# Patient Record
Sex: Male | Born: 1945
Health system: Southern US, Community
[De-identification: ages and names within clinical notes are randomized; demographics above are authoritative.]

## PROBLEM LIST (undated history)

## (undated) DIAGNOSIS — E7800 Pure hypercholesterolemia, unspecified: Secondary | ICD-10-CM

## (undated) DIAGNOSIS — J449 Chronic obstructive pulmonary disease, unspecified: Secondary | ICD-10-CM

## (undated) DIAGNOSIS — I739 Peripheral vascular disease, unspecified: Secondary | ICD-10-CM

## (undated) DIAGNOSIS — R911 Solitary pulmonary nodule: Secondary | ICD-10-CM

## (undated) DIAGNOSIS — E119 Type 2 diabetes mellitus without complications: Secondary | ICD-10-CM

## (undated) DIAGNOSIS — H35039 Hypertensive retinopathy, unspecified eye: Secondary | ICD-10-CM

## (undated) DIAGNOSIS — R0600 Dyspnea, unspecified: Secondary | ICD-10-CM

## (undated) DIAGNOSIS — M1990 Unspecified osteoarthritis, unspecified site: Secondary | ICD-10-CM

## (undated) DIAGNOSIS — I10 Essential (primary) hypertension: Secondary | ICD-10-CM

## (undated) DIAGNOSIS — Z923 Personal history of irradiation: Secondary | ICD-10-CM

## (undated) DIAGNOSIS — K219 Gastro-esophageal reflux disease without esophagitis: Secondary | ICD-10-CM

## (undated) HISTORY — PX: APPENDECTOMY: SHX54

## (undated) HISTORY — PX: CHOLECYSTECTOMY: SHX55

---

## 2001-11-18 ENCOUNTER — Emergency Department (HOSPITAL_COMMUNITY): Admission: EM | Admit: 2001-11-18 | Discharge: 2001-11-18 | Payer: Self-pay

## 2014-02-01 ENCOUNTER — Encounter (HOSPITAL_COMMUNITY): Payer: Self-pay | Admitting: Emergency Medicine

## 2014-02-01 ENCOUNTER — Emergency Department (INDEPENDENT_AMBULATORY_CARE_PROVIDER_SITE_OTHER)
Admission: EM | Admit: 2014-02-01 | Discharge: 2014-02-01 | Disposition: A | Discharge status: Home / Self Care | Payer: Self-pay | Source: Home / Self Care

## 2014-02-01 DIAGNOSIS — M79604 Pain in right leg: Secondary | ICD-10-CM

## 2014-02-01 DIAGNOSIS — M79609 Pain in unspecified limb: Secondary | ICD-10-CM

## 2014-02-01 DIAGNOSIS — I739 Peripheral vascular disease, unspecified: Secondary | ICD-10-CM

## 2014-02-01 DIAGNOSIS — M722 Plantar fascial fibromatosis: Secondary | ICD-10-CM

## 2014-02-01 NOTE — ED Provider Notes (Signed)
CSN: [id number]     Arrival date & time 02/01/14  1343 History   First MD Initiated Contact with Patient 02/01/14 1528     No chief complaint on file.  (Consider location/radiation/quality/duration/timing/severity/associated sxs/prior Treatment) HPI Comments: 68 year old male complaining of pain in the left leg. More specifically the pain radiates from the knee to the tips of the toes. He has tingling in the feet. The majority of the pain with weightbearing he is in the ankle and plantar aspect of the foot. The symptoms began approximately 2 weeks ago. He denies any known trauma or event that may have injured his leg or produced the symptoms.   No past medical history on file. No past surgical history on file. No family history on file. History  Substance Use Topics  . Smoking status: Not on file  . Smokeless tobacco: Not on file  . Alcohol Use: Not on file    Review of Systems  Constitutional: Negative.   Respiratory: Negative.   Gastrointestinal: Negative.   Genitourinary: Negative.   Musculoskeletal: Negative for arthralgias, myalgias and neck pain.       As per HPI  Skin: Negative.   Neurological: Negative for dizziness, weakness, numbness and headaches.    Allergies  Review of patient's allergies indicates not on file.  Home Medications  No current outpatient prescriptions on file. BP 187/91  Pulse 64  Temp(Src) 97.7 F (36.5 C) (Oral)  Resp 20  SpO2 97% Physical Exam  Constitutional: He is oriented to person, place, and time. He appears well-developed and well-nourished.  HENT:  Head: Normocephalic and atraumatic.  Eyes: EOM are normal.  Neck: Normal range of motion. Neck supple.  Pulmonary/Chest: Effort normal. No respiratory distress.  Musculoskeletal:  Miner, 1+ nonpitting edema and bilateral lower legs. Palpation and examination of the knee reveals no abnormalities. No tenderness to the knee no decrease in range of motion, no swelling, discoloration or  laxity. Palpation of the calf is nontender. There is no swelling or nodules. Ankle exam is normal with full range of motion and no bony tenderness. Palpation of the reveals good circulation, 2+ pedal pulse, normal color and warmth. There is tenderness to the plantar heel that is worse when he first plans his foot on the floor in the morning. There is also a change in the anterior Sheehan with a scant becoming slightly shiny and darkening.  Neurological: He is alert and oriented to person, place, and time. No cranial nerve deficit.  Skin: Skin is warm and dry.  Psychiatric: He has a normal mood and affect.    ED Course  Procedures (including critical care time) Labs Review Labs Reviewed - No data to display Imaging Review No results found.    MDM   1. Leg pain, right   2. Plantar fasciitis of left foot   3. Peripheral vascular disease of lower extremity      Recommend rolling foot over ice cold can several times a day Painful full length she supports with high arches. Recommend [place] PCP regarding circulation of the legs and other evaluations said she had not seen a physician in over 10 years. Suspect there may be peripheral vascular disease. Recommend stop smoking. No evidence of DVT or ischemic changes.    Hayden Rasmussen, NP 02/01/14 [phone]

## 2014-02-01 NOTE — Discharge Instructions (Signed)
Plantar Fasciitis Plantar fasciitis is a common condition that causes foot pain. It is soreness (inflammation) of the band of tough fibrous tissue on the bottom of the foot that runs from the heel bone (calcaneus) to the ball of the foot. The cause of this soreness may be from excessive standing, poor fitting shoes, running on hard surfaces, being overweight, having an abnormal walk, or overuse (this is common in runners) of the painful foot or feet. It is also common in aerobic exercise dancers and ballet dancers. SYMPTOMS  Most people with plantar fasciitis complain of:  Severe pain in the morning on the bottom of their foot especially when taking the first steps out of bed. This pain recedes after a few minutes of walking.  Severe pain is experienced also during walking following a long period of inactivity.  Pain is worse when walking barefoot or up stairs DIAGNOSIS   Your caregiver will diagnose this condition by examining and feeling your foot.  Special tests such as X-rays of your foot, are usually not needed. PREVENTION   Consult a sports medicine professional before beginning a new exercise program.  Walking programs offer a good workout. With walking there is a lower chance of overuse injuries common to runners. There is less impact and less jarring of the joints.  Begin all new exercise programs slowly. If problems or pain develop, decrease the amount of time or distance until you are at a comfortable level.  Wear good shoes and replace them regularly.  Stretch your foot and the heel cords at the back of the ankle (Achilles tendon) both before and after exercise.  Run or exercise on even surfaces that are not hard. For example, asphalt is better than pavement.  Do not run barefoot on hard surfaces.  If using a treadmill, vary the incline.  Do not continue to workout if you have foot or joint problems. Seek professional help if they do not improve. HOME CARE INSTRUCTIONS     Avoid activities that cause you pain until you recover.  Use ice or cold packs on the problem or painful areas after working out.  Only take over-the-counter or prescription medicines for pain, discomfort, or fever as directed by your caregiver.  Soft shoe inserts or athletic shoes with air or gel sole cushions may be helpful.  If problems continue or become more severe, consult a sports medicine caregiver or your own health care provider. Cortisone is a potent anti-inflammatory medication that may be injected into the painful area. You can discuss this treatment with your caregiver. MAKE SURE YOU:   Understand these instructions.  Will watch your condition.  Will get help right away if you are not doing well or get worse. Document Released: 09/06/2001 Document Revised: 03/05/2012 Document Reviewed: 11/05/2008 [employer] Patient Information 2014 [employer]. Plantar Fasciitis (Heel Spur Syndrome) with Rehab The plantar fascia is a fibrous, ligament-like, soft-tissue structure that spans the bottom of the foot. Plantar fasciitis is a condition that causes pain in the foot due to inflammation of the tissue. SYMPTOMS  Pain and tenderness on the underneath side of the foot. Pain that worsens with standing or walking. CAUSES  Plantar fasciitis is caused by irritation and injury to the plantar fascia on the underneath side of the foot. Common mechanisms of injury include: Direct trauma to bottom of the foot. Damage to a small nerve that runs under the foot where the main fascia attaches to the heel bone. Stress placed on the plantar fascia due   to bone spurs. RISK INCREASES WITH:  Activities that place stress on the plantar fascia (running, jumping, pivoting, or cutting). Poor strength and flexibility. Improperly fitted shoes. Tight calf muscles. Flat feet. Failure to warm-up properly before activity. Obesity. PREVENTION Warm up and stretch properly before activity. Allow for  adequate recovery between workouts. Maintain physical fitness: Strength, flexibility, and endurance. Cardiovascular fitness. Maintain a health body weight. Avoid stress on the plantar fascia. Wear properly fitted shoes, including arch supports for individuals who have flat feet. PROGNOSIS  If treated properly, then the symptoms of plantar fasciitis usually resolve without surgery. However, occasionally surgery is necessary. RELATED COMPLICATIONS  Recurrent symptoms that may result in a chronic condition. Problems of the lower back that are caused by compensating for the injury, such as limping. Pain or weakness of the foot during push-off following surgery. Chronic inflammation, scarring, and partial or complete fascia tear, occurring more often from repeated injections. TREATMENT  Treatment initially involves the use of ice and medication to help reduce pain and inflammation. The use of strengthening and stretching exercises may help reduce pain with activity, especially stretches of the Achilles tendon. These exercises may be performed at home or with a therapist. Your caregiver may recommend that you use heel cups of arch supports to help reduce stress on the plantar fascia. Occasionally, corticosteroid injections are given to reduce inflammation. If symptoms persist for greater than 6 months despite non-surgical (conservative), then surgery may be recommended.  MEDICATION  If pain medication is necessary, then nonsteroidal anti-inflammatory medications, such as aspirin and ibuprofen, or other minor pain relievers, such as acetaminophen, are often recommended. Do not take pain medication within 7 days before surgery. Prescription pain relievers may be given if deemed necessary by your caregiver. Use only as directed and only as much as you need. Corticosteroid injections may be given by your caregiver. These injections should be reserved for the most serious cases, because they may only be  given a certain number of times. HEAT AND COLD Cold treatment (icing) relieves pain and reduces inflammation. Cold treatment should be applied for 10 to 15 minutes every 2 to 3 hours for inflammation and pain and immediately after any activity that aggravates your symptoms. Use ice packs or massage the area with a piece of ice (ice massage). Heat treatment may be used prior to performing the stretching and strengthening activities prescribed by your caregiver, physical therapist, or athletic trainer. Use a heat pack or soak the injury in warm water. SEEK IMMEDIATE MEDICAL CARE IF: Treatment seems to offer no benefit, or the condition worsens. Any medications produce adverse side effects. EXERCISES RANGE OF MOTION (ROM) AND STRETCHING EXERCISES - Plantar Fasciitis (Heel Spur Syndrome) These exercises may help you when beginning to rehabilitate your injury. Your symptoms may resolve with or without further involvement from your physician, physical therapist or athletic trainer. While completing these exercises, remember:  Restoring tissue flexibility helps normal motion to return to the joints. This allows healthier, less painful movement and activity. An effective stretch should be held for at least 30 seconds. A stretch should never be painful. You should only feel a gentle lengthening or release in the stretched tissue. RANGE OF MOTION - Toe Extension, Flexion Sit with your right / left leg crossed over your opposite knee. Grasp your toes and gently pull them back toward the top of your foot. You should feel a stretch on the bottom of your toes and/or foot. Hold this stretch for __________ seconds. Now, gently   pull your toes toward the bottom of your foot. You should feel a stretch on the top of your toes and or foot. Hold this stretch for __________ seconds. Repeat __________ times. Complete this stretch __________ times per day.  RANGE OF MOTION - Ankle Dorsiflexion, Active Assisted Remove  shoes and sit on a chair that is preferably not on a carpeted surface. Place right / left foot under knee. Extend your opposite leg for support. Keeping your heel down, slide your right / left foot back toward the chair until you feel a stretch at your ankle or calf. If you do not feel a stretch, slide your bottom forward to the edge of the chair, while still keeping your heel down. Hold this stretch for __________ seconds. Repeat __________ times. Complete this stretch __________ times per day.  STRETCH  Gastroc, Standing Place hands on wall. Extend right / left leg, keeping the front knee somewhat bent. Slightly point your toes inward on your back foot. Keeping your right / left heel on the floor and your knee straight, shift your weight toward the wall, not allowing your back to arch. You should feel a gentle stretch in the right / left calf. Hold this position for __________ seconds. Repeat __________ times. Complete this stretch __________ times per day. STRETCH  Soleus, Standing Place hands on wall. Extend right / left leg, keeping the other knee somewhat bent. Slightly point your toes inward on your back foot. Keep your right / left heel on the floor, bend your back knee, and slightly shift your weight over the back leg so that you feel a gentle stretch deep in your back calf. Hold this position for __________ seconds. Repeat __________ times. Complete this stretch __________ times per day. STRETCH  Gastrocsoleus, Standing  Note: This exercise can place a lot of stress on your foot and ankle. Please complete this exercise only if specifically instructed by your caregiver.  Place the ball of your right / left foot on a step, keeping your other foot firmly on the same step. Hold on to the wall or a rail for balance. Slowly lift your other foot, allowing your body weight to press your heel down over the edge of the step. You should feel a stretch in your right / left calf. Hold this  position for __________ seconds. Repeat this exercise with a slight bend in your right / left knee. Repeat __________ times. Complete this stretch __________ times per day.  STRENGTHENING EXERCISES - Plantar Fasciitis (Heel Spur Syndrome)  These exercises may help you when beginning to rehabilitate your injury. They may resolve your symptoms with or without further involvement from your physician, physical therapist or athletic trainer. While completing these exercises, remember:  Muscles can gain both the endurance and the strength needed for everyday activities through controlled exercises. Complete these exercises as instructed by your physician, physical therapist or athletic trainer. Progress the resistance and repetitions only as guided. STRENGTH - Towel Curls Sit in a chair positioned on a non-carpeted surface. Place your foot on a towel, keeping your heel on the floor. Pull the towel toward your heel by only curling your toes. Keep your heel on the floor. If instructed by your physician, physical therapist or athletic trainer, add ____________________ at the end of the towel. Repeat __________ times. Complete this exercise __________ times per day. STRENGTH - Ankle Inversion Secure one end of a rubber exercise band/tubing to a fixed object (table, pole). Loop the other end around your foot   just before your toes. Place your fists between your knees. This will focus your strengthening at your ankle. Slowly, pull your big toe up and in, making sure the band/tubing is positioned to resist the entire motion. Hold this position for __________ seconds. Have your muscles resist the band/tubing as it slowly pulls your foot back to the starting position. Repeat __________ times. Complete this exercises __________ times per day.  Document Released: 12/12/2005 Document Revised: 03/05/2012 Document Reviewed: 03/26/2009 Exit[employer] Patient Information 2014 [employer].  

## 2014-02-01 NOTE — ED Notes (Signed)
Discussed follow up care with a physician.  Discussed flyers advertising local options for pcp

## 2014-02-01 NOTE — ED Provider Notes (Signed)
Medical screening examination/treatment/procedure(s) were performed by a resident physician or non-physician practitioner and as the supervising physician I was immediately available for consultation/collaboration.  Clementeen GrahamEvan Corey, MD    Rodolph BongEvan S Corey, MD 02/01/14 [phone]

## 2014-02-01 NOTE — ED Notes (Signed)
Patient reports bilateral foot swelling and back pain.  Complaints for 2 weeks.  Patient has been using heating pad

## 2016-04-21 ENCOUNTER — Ambulatory Visit (INDEPENDENT_AMBULATORY_CARE_PROVIDER_SITE_OTHER): Payer: Medicare HMO

## 2016-04-21 ENCOUNTER — Encounter (HOSPITAL_COMMUNITY): Payer: Self-pay | Admitting: Emergency Medicine

## 2016-04-21 ENCOUNTER — Ambulatory Visit (HOSPITAL_COMMUNITY)
Admission: EM | Admit: 2016-04-21 | Discharge: 2016-04-21 | Disposition: A | Discharge status: Home / Self Care | Payer: Medicare HMO | Attending: Emergency Medicine | Admitting: Emergency Medicine

## 2016-04-21 DIAGNOSIS — I10 Essential (primary) hypertension: Secondary | ICD-10-CM

## 2016-04-21 DIAGNOSIS — M545 Low back pain, unspecified: Secondary | ICD-10-CM

## 2016-04-21 MED ORDER — CYCLOBENZAPRINE HCL 10 MG PO TABS: 10.0000 mg | ORAL_TABLET | Freq: Two times a day (BID) | ORAL | Status: DC | PRN

## 2016-04-21 MED ORDER — TRIAMTERENE-HCTZ 75-50 MG PO TABS: 1.0000 | ORAL_TABLET | Freq: Every day | ORAL | Status: DC

## 2016-04-21 NOTE — Discharge Instructions (Signed)
Back Pain, Adult Back pain is very common. The pain often gets better over time. The cause of back pain is usually not dangerous. Most people can learn to manage their back pain on their own.  HOME CARE  Watch your back pain for any changes. The following actions may help to lessen any pain you are feeling:  Stay active. Start with short walks on flat ground if you can. Try to walk farther each day.  Exercise regularly as told by your doctor. Exercise helps your back heal faster. It also helps avoid future injury by keeping your muscles strong and flexible.  Do not sit, drive, or stand in one place for more than 30 minutes.  Do not stay in bed. Resting more than 1-2 days can slow down your recovery.  Be careful when you bend or lift an object. Use good form when lifting:  Bend at your knees.  Keep the object close to your body.  Do not twist.  Sleep on a firm mattress. Lie on your side, and bend your knees. If you lie on your back, put a pillow under your knees.  Take medicines only as told by your doctor.  Put ice on the injured area.  Put ice in a plastic bag.  Place a towel between your skin and the bag.  Leave the ice on for 20 minutes, 2-3 times a day for the first 2-3 days. After that, you can switch between ice and heat packs.  Avoid feeling anxious or stressed. Find good ways to deal with stress, such as exercise.  Maintain a healthy weight. Extra weight puts stress on your back. GET HELP IF:   You have pain that does not go away with rest or medicine.  You have worsening pain that goes down into your legs or buttocks.  You have pain that does not get better in one week.  You have pain at night.  You lose weight.  You have a fever or chills. GET HELP RIGHT AWAY IF:   You cannot control when you poop (bowel movement) or pee (urinate).  Your arms or legs feel weak.  Your arms or legs lose feeling (numbness).  You feel sick to your stomach (nauseous) or  throw up (vomit).  You have belly (abdominal) pain.  You feel like you may pass out (faint).   This information is not intended to replace advice given to you by your health care provider. Make sure you discuss any questions you have with your health care provider.   Document Released: 05/30/2008 Document Revised: 01/02/2015 Document Reviewed: 04/15/2014 Elsevier Interactive Patient Education 2016 [employer]. Hypertension Hypertension is another name for high blood pressure. High blood pressure forces your heart to work harder to pump blood. A blood pressure reading has two numbers, which includes a higher number over a lower number (example: 110/72). HOME CARE   Have your blood pressure rechecked by your doctor.  Only take medicine as told by your doctor. Follow the directions carefully. The medicine does not work as well if you skip doses. Skipping doses also puts you at risk for problems.  Do not smoke.  Monitor your blood pressure at home as told by your doctor. GET HELP IF:  You think you are having a reaction to the medicine you are taking.  You have repeat headaches or feel dizzy.  You have puffiness (swelling) in your ankles.  You have trouble with your vision. GET HELP RIGHT AWAY IF:   You get  a very bad headache and are confused.  You feel weak, numb, or faint.  You get chest or belly (abdominal) pain.  You throw up (vomit).  You cannot breathe very well. MAKE SURE YOU:   Understand these instructions.  Will watch your condition.  Will get help right away if you are not doing well or get worse.   This information is not intended to replace advice given to you by your health care provider. Make sure you discuss any questions you have with your health care provider.   Document Released: 05/30/2008 Document Revised: 12/17/2013 Document Reviewed: 10/04/2013 Elsevier Interactive Patient Education [employer]

## 2016-04-21 NOTE — ED Notes (Signed)
Pt was mowing his yard on Saturday when he noticed a pain in his left lower back.  He states the pain radiates down his hip into the back of his left knee.

## 2016-04-21 NOTE — ED Provider Notes (Signed)
CSN: [id number]     Arrival date & time 04/21/16  1432 History   First MD Initiated Contact with Patient 04/21/16 1634     Chief Complaint  Patient presents with  . Back Pain   (Consider location/radiation/quality/duration/timing/severity/associated sxs/prior Treatment) HPI  History reviewed. No pertinent past medical history. History reviewed. No pertinent past surgical history. History reviewed. No pertinent family history. Social History  Substance Use Topics  . Smoking status: Never Smoker   . Smokeless tobacco: None  . Alcohol Use: Yes    Review of Systems  Allergies  Review of patient's allergies indicates no known allergies.  Home Medications   Prior to Admission medications   Not on File   Meds Ordered and Administered this Visit  Medications - No data to display  BP 209/100 mmHg  Pulse 64  Temp(Src) 98.1 F (36.7 C) (Oral)  Resp 16  SpO2 97% No data found.   Physical Exam NURSES NOTES AND VITAL SIGNS REVIEWED. CONSTITUTIONAL: Well developed, well nourished, no acute distress HEENT: normocephalic, atraumatic EYES: Conjunctiva normal NECK:normal ROM, supple, no adenopathy CV: RRR with murmur. No PMI displacement PULMONARY:No respiratory distress, normal effort ABDOMINAL: Soft, ND, NT BS+, No CVAT MUSCULOSKELETAL: Normal ROM of all extremities, LUMBA Spine palpable spasm, minimal tenderness.  SKIN: warm and dry without rash PSYCHIATRIC: Mood and affect, behavior are normal   ED Course  Procedures (including critical care time)  Labs Review Labs Reviewed - No data to display  Imaging Review Dg Lumbar Spine Complete  04/21/2016  CLINICAL DATA:  LEFT lower back pain for 5 days. Tingling down LEFT side of the leg. EXAM: LUMBAR SPINE - COMPLETE 4+ VIEW COMPARISON:  None. FINDINGS: There is joint space narrowing of the L5-S1 vertebral body level with endplate osteophytosis. No subluxation. No acute loss of vertebral body height or disc height. No pars  fracture. IMPRESSION: 1. No acute findings lumbar spine. 2. Disc osteophytic disease most severe at L5-S1. Electronically Signed   By: Genevive Bi M.D.   On: 04/21/2016 17:09    I HAVE PERSONALLY  REVIEWED AND DISCUSSED RESULTS OF  X-RAYS WITH PATIENT  PRIOR TO DISCHARGE.    Visual Acuity Review  Right Eye Distance:   Left Eye Distance:   Bilateral Distance:    Right Eye Near:   Left Eye Near:    Bilateral Near:      Rx: because of the level of your blood pressure it is best to start you on a combination medicine to try and help lower your pressure. MAXZIDE AND FLEXERIL FOR BACK PAIN  Total Visit Time:22 "GREATER THAN 50% WAS SPENT IN COUNSELING AND COORDINATION OF CARE WITH THE PATIENT" DISCUSSION OF TREATMENT PLAN FOR HIS BACK PAIN AND NEW MEDICATIONS FOR HYPERTENSION. ALSO DISCUSSED NEED OF PCP.   MDM   1. HTN (hypertension) with goal to be determined   2. Left-sided low back pain without sciatica     Patient is reassured that there are no issues that require transfer to higher level of care at this time or additional tests. Patient is advised to continue home symptomatic treatment. Patient is advised that if there are new or worsening symptoms to attend the emergency department, contact primary care provider, or return to [facility]. Instructions of care provided discharged home in stable condition.    THIS NOTE WAS GENERATED USING A VOICE RECOGNITION SOFTWARE PROGRAM. ALL REASONABLE EFFORTS  WERE MADE TO PROOFREAD THIS DOCUMENT FOR ACCURACY.  I have verbally reviewed the discharge instructions with  the patient. A printed AVS was given to the patient.  All questions were answered prior to discharge.     Tharon Aquas, PA 04/21/16 1721

## 2016-04-21 NOTE — ED Notes (Addendum)
Pt's BP is elevated today.  He reports some dizziness over the last three weeks with some numbness and tingling in his fingertips and some SOB with exertion.  He denies any headaches, vision changes or chest pain.  Pt is asymptomatic today.

## 2016-06-03 ENCOUNTER — Emergency Department (HOSPITAL_COMMUNITY)
Admission: EM | Admit: 2016-06-03 | Discharge: 2016-06-03 | Disposition: A | Discharge status: Home / Self Care | Payer: Commercial Managed Care - HMO | Attending: Emergency Medicine | Admitting: Emergency Medicine

## 2016-06-03 ENCOUNTER — Encounter (HOSPITAL_COMMUNITY): Payer: Self-pay | Admitting: Emergency Medicine

## 2016-06-03 ENCOUNTER — Ambulatory Visit (HOSPITAL_COMMUNITY)
Admission: EM | Admit: 2016-06-03 | Discharge: 2016-06-03 | Disposition: A | Discharge status: Nursing Home (Includes ICF) | Payer: Commercial Managed Care - HMO | Attending: Family Medicine | Admitting: Family Medicine

## 2016-06-03 ENCOUNTER — Encounter (HOSPITAL_COMMUNITY): Payer: Self-pay

## 2016-06-03 DIAGNOSIS — F17200 Nicotine dependence, unspecified, uncomplicated: Secondary | ICD-10-CM | POA: Diagnosis not present

## 2016-06-03 DIAGNOSIS — I10 Essential (primary) hypertension: Secondary | ICD-10-CM | POA: Insufficient documentation

## 2016-06-03 DIAGNOSIS — R062 Wheezing: Secondary | ICD-10-CM | POA: Diagnosis not present

## 2016-06-03 DIAGNOSIS — E669 Obesity, unspecified: Secondary | ICD-10-CM | POA: Diagnosis not present

## 2016-06-03 DIAGNOSIS — R600 Localized edema: Secondary | ICD-10-CM | POA: Diagnosis not present

## 2016-06-03 DIAGNOSIS — Z760 Encounter for issue of repeat prescription: Secondary | ICD-10-CM | POA: Insufficient documentation

## 2016-06-03 DIAGNOSIS — M545 Low back pain: Secondary | ICD-10-CM | POA: Diagnosis not present

## 2016-06-03 DIAGNOSIS — Z79899 Other long term (current) drug therapy: Secondary | ICD-10-CM | POA: Insufficient documentation

## 2016-06-03 DIAGNOSIS — M170 Bilateral primary osteoarthritis of knee: Secondary | ICD-10-CM | POA: Diagnosis not present

## 2016-06-03 DIAGNOSIS — Z6831 Body mass index (BMI) 31.0-31.9, adult: Secondary | ICD-10-CM | POA: Diagnosis not present

## 2016-06-03 HISTORY — DX: Essential (primary) hypertension: I10

## 2016-06-03 LAB — I-STAT CHEM 8, ED
BUN: 9 mg/dL (ref 6–20)
CALCIUM ION: 1.18 mmol/L (ref 1.13–1.30)
CREATININE: 0.9 mg/dL (ref 0.61–1.24)
Chloride: 106 mmol/L (ref 101–111)
GLUCOSE: 98 mg/dL (ref 65–99)
HCT: 49 % (ref 39.0–52.0)
Hemoglobin: 16.7 g/dL (ref 13.0–17.0)
Potassium: 5.1 mmol/L (ref 3.5–5.1)
SODIUM: 141 mmol/L (ref 135–145)
TCO2: 28 mmol/L (ref 0–100)

## 2016-06-03 MED ORDER — TRIAMTERENE-HCTZ 75-50 MG PO TABS: 1.0000 | ORAL_TABLET | Freq: Every day | ORAL | Status: DC

## 2016-06-03 NOTE — ED Notes (Signed)
Pt sent by UC for hypertension. Pt out of BP medication x 2 weeks. Has appoitment with PCP in Aug to refill. Pt denies any headache or blurry vision.

## 2016-06-03 NOTE — Discharge Instructions (Signed)
Read the information below.  Use the prescribed medication as directed.  Please discuss all new medications with your pharmacist.  You may return to the Emergency Department at any time for worsening condition or any new symptoms that concern you.  Please recheck your blood pressure within 1 week.  Follow up with your new primary care provider Dr Valentina Lucks as soon as possible.  If you develop chest pain, shortness of breath, weakness or numbness in your arms or legs, difficulty walking or speaking, return to the ER for a recheck.      DASH Eating Plan DASH stands for "Dietary Approaches to Stop Hypertension." The DASH eating plan is a healthy eating plan that has been shown to reduce high blood pressure (hypertension). Additional health benefits may include reducing the risk of type 2 diabetes mellitus, heart disease, and stroke. The DASH eating plan may also help with weight loss. WHAT DO I NEED TO KNOW ABOUT THE DASH EATING PLAN? For the DASH eating plan, you will follow these general guidelines:  Choose foods with a percent daily value for sodium of less than 5% (as listed on the food label).  Use salt-free seasonings or herbs instead of table salt or sea salt.  Check with your health care provider or pharmacist before using salt substitutes.  Eat lower-sodium products, often labeled as "lower sodium" or "no salt added."  Eat fresh foods.  Eat more vegetables, fruits, and low-fat dairy products.  Choose whole grains. Look for the word "whole" as the first word in the ingredient list.  Choose fish and skinless chicken or [place] more often than red meat. Limit fish, poultry, and meat to 6 oz (170 g) each day.  Limit sweets, desserts, sugars, and sugary drinks.  Choose heart-healthy fats.  Limit cheese to 1 oz (28 g) per day.  Eat more home-cooked food and less restaurant, buffet, and fast food.  Limit fried foods.  Cook foods using methods other than frying.  Limit canned  vegetables. If you do use them, rinse them well to decrease the sodium.  When eating at a restaurant, ask that your food be prepared with less salt, or no salt if possible. WHAT FOODS CAN I EAT? Seek help from a dietitian for individual calorie needs. Grains Whole grain or whole wheat bread. Brown rice. Whole grain or whole wheat pasta. Quinoa, bulgur, and whole grain cereals. Low-sodium cereals. Corn or whole wheat flour tortillas. Whole grain cornbread. Whole grain crackers. Low-sodium crackers. Vegetables Fresh or frozen vegetables (raw, steamed, roasted, or grilled). Low-sodium or reduced-sodium tomato and vegetable juices. Low-sodium or reduced-sodium tomato sauce and paste. Low-sodium or reduced-sodium canned vegetables.  Fruits All fresh, canned (in natural juice), or frozen fruits. Meat and Other Protein Products Ground beef (85% or leaner), grass-fed beef, or beef trimmed of fat. Skinless chicken or [place]. Ground chicken or [place]. Pork trimmed of fat. All fish and seafood. Eggs. Dried beans, peas, or lentils. Unsalted nuts and seeds. Unsalted canned beans. Dairy Low-fat dairy products, such as skim or 1% milk, 2% or reduced-fat cheeses, low-fat ricotta or cottage cheese, or plain low-fat yogurt. Low-sodium or reduced-sodium cheeses. Fats and Oils Tub margarines without trans fats. Light or reduced-fat mayonnaise and salad dressings (reduced sodium). Avocado. Safflower, olive, or canola oils. Natural peanut or almond butter. Other Unsalted popcorn and pretzels. The items listed above may not be a complete list of recommended foods or beverages. Contact your dietitian for more options. WHAT FOODS ARE NOT RECOMMENDED? Grains White bread.  White pasta. White rice. Refined cornbread. Bagels and croissants. Crackers that contain trans fat. Vegetables Creamed or fried vegetables. Vegetables in a cheese sauce. Regular canned vegetables. Regular canned tomato sauce and paste. Regular tomato  and vegetable juices. Fruits Dried fruits. Canned fruit in light or heavy syrup. Fruit juice. Meat and Other Protein Products Fatty cuts of meat. Ribs, chicken wings, bacon, sausage, bologna, salami, chitterlings, fatback, hot dogs, bratwurst, and packaged luncheon meats. Salted nuts and seeds. Canned beans with salt. Dairy Whole or 2% milk, cream, half-and-half, and cream cheese. Whole-fat or sweetened yogurt. Full-fat cheeses or blue cheese. Nondairy creamers and whipped toppings. Processed cheese, cheese spreads, or cheese curds. Condiments Onion and garlic salt, seasoned salt, table salt, and sea salt. Canned and packaged gravies. Worcestershire sauce. Tartar sauce. Barbecue sauce. Teriyaki sauce. Soy sauce, including reduced sodium. Steak sauce. Fish sauce. Oyster sauce. Cocktail sauce. Horseradish. Ketchup and mustard. Meat flavorings and tenderizers. Bouillon cubes. Hot sauce. Tabasco sauce. Marinades. Taco seasonings. Relishes. Fats and Oils Butter, stick margarine, lard, shortening, ghee, and bacon fat. Coconut, palm kernel, or palm oils. Regular salad dressings. Other Pickles and olives. Salted popcorn and pretzels. The items listed above may not be a complete list of foods and beverages to avoid. Contact your dietitian for more information. WHERE CAN I FIND MORE INFORMATION? National Heart, Lung, and Blood Institute: [employer]   This information is not intended to replace advice given to you by your health care provider. Make sure you discuss any questions you have with your health care provider.   Document Released: 12/01/2011 Document Revised: 01/02/2015 Document Reviewed: 10/16/2013 Elsevier Interactive Patient Education 2016 [employer].  How to Take Your Blood Pressure HOW DO I GET A BLOOD PRESSURE MACHINE?  You can buy an electronic home blood pressure machine at your local pharmacy. Insurance will sometimes cover the cost if you  have a prescription.  Ask your doctor what type of machine is best for you. There are different machines for your arm and your wrist.  If you decide to buy a machine to check your blood pressure on your arm, first check the size of your arm so you can buy the right size cuff. To check the size of your arm:   Use a measuring tape that shows both inches and centimeters.   Wrap the measuring tape around the upper-middle part of your arm. You may need someone to help you measure.   Write down your arm measurement in both inches and centimeters.   To measure your blood pressure correctly, it is important to have the right size cuff.   If your arm is up to 13 inches (up to 34 centimeters), get an adult cuff size.  If your arm is 13 to 17 inches (35 to 44 centimeters), get a large adult cuff size.    If your arm is 17 to 20 inches (45 to 52 centimeters), get an adult thigh cuff.  WHAT DO THE NUMBERS MEAN?   There are two numbers that make up your blood pressure. For example: 120/80.  The first number (120 in our example) is called the "systolic pressure." It is a measure of the pressure in your blood vessels when your heart is pumping blood.  The second number (80 in our example) is called the "diastolic pressure." It is a measure of the pressure in your blood vessels when your heart is resting between beats.  Your doctor will tell you what your blood pressure should be. WHAT SHOULD  I DO BEFORE I CHECK MY BLOOD PRESSURE?   Try to rest or relax for at least 30 minutes before you check your blood pressure.  Do not smoke.  Do not have any drinks with caffeine, such as:  Soda.  Coffee.  Tea.  Check your blood pressure in a quiet room.  Sit down and stretch out your arm on a table. Keep your arm at about the level of your heart. Let your arm relax.  Make sure that your legs are not crossed. HOW DO I CHECK MY BLOOD PRESSURE?  Follow the directions that came with your  machine.  Make sure you remove any tight-fitting clothing from your arm or wrist. Wrap the cuff around your upper arm or wrist. You should be able to fit a finger between the cuff and your arm. If you cannot fit a finger between the cuff and your arm, it is too tight and should be removed and rewrapped.  Some units require you to manually pump up the arm cuff.  Automatic units inflate the cuff when you press a button.  Cuff deflation is automatic in both models.  After the cuff is inflated, the unit measures your blood pressure and pulse. The readings are shown on a monitor. Hold still and breathe normally while the cuff is inflated.  Getting a reading takes less than a minute.  Some models store readings in a memory. Some provide a printout of readings. If your machine does not store your readings, keep a written record.  Take readings with you to your next visit with your doctor.   This information is not intended to replace advice given to you by your health care provider. Make sure you discuss any questions you have with your health care provider.   Document Released: 11/24/2008 Document Revised: 01/02/2015 Document Reviewed: 02/06/2014 Elsevier Interactive Patient Education 2016 [employer].  Hypertension Hypertension, commonly called high blood pressure, is when the force of blood pumping through your arteries is too strong. Your arteries are the blood vessels that carry blood from your heart throughout your body. A blood pressure reading consists of a higher number over a lower number, such as 110/72. The higher number (systolic) is the pressure inside your arteries when your heart pumps. The lower number (diastolic) is the pressure inside your arteries when your heart relaxes. Ideally you want your blood pressure below 120/80. Hypertension forces your heart to work harder to pump blood. Your arteries may become narrow or stiff. Having untreated or uncontrolled hypertension can  cause heart attack, stroke, kidney disease, and other problems. RISK FACTORS Some risk factors for high blood pressure are controllable. Others are not.  Risk factors you cannot control include:   Race. You may be at higher risk if you are African American.  Age. Risk increases with age.  Gender. Men are at higher risk than women before age 71 years. After age 89, women are at higher risk than men. Risk factors you can control include:  Not getting enough exercise or physical activity.  Being overweight.  Getting too much fat, sugar, calories, or salt in your diet.  Drinking too much alcohol. SIGNS AND SYMPTOMS Hypertension does not usually cause signs or symptoms. Extremely high blood pressure (hypertensive crisis) may cause headache, anxiety, shortness of breath, and nosebleed. DIAGNOSIS To check if you have hypertension, your health care provider will measure your blood pressure while you are seated, with your arm held at the level of your heart. It should be  measured at least twice using the same arm. Certain conditions can cause a difference in blood pressure between your right and left arms. A blood pressure reading that is higher than normal on one occasion does not mean that you need treatment. If it is not clear whether you have high blood pressure, you may be asked to return on a different day to have your blood pressure checked again. Or, you may be asked to monitor your blood pressure at home for 1 or more weeks. TREATMENT Treating high blood pressure includes making lifestyle changes and possibly taking medicine. Living a healthy lifestyle can help lower high blood pressure. You may need to change some of your habits. Lifestyle changes may include:  Following the DASH diet. This diet is high in fruits, vegetables, and whole grains. It is low in salt, red meat, and added sugars.  Keep your sodium intake below 2,300 mg per day.  Getting at least 30-45 minutes of aerobic  exercise at least 4 times per week.  Losing weight if necessary.  Not smoking.  Limiting alcoholic beverages.  Learning ways to reduce stress. Your health care provider may prescribe medicine if lifestyle changes are not enough to get your blood pressure under control, and if one of the following is true:  You are 44-70 years of age and your systolic blood pressure is above 140.  You are 54 years of age or older, and your systolic blood pressure is above 150.  Your diastolic blood pressure is above 90.  You have diabetes, and your systolic blood pressure is over 140 or your diastolic blood pressure is over 90.  You have kidney disease and your blood pressure is above 140/90.  You have heart disease and your blood pressure is above 140/90. Your personal target blood pressure may vary depending on your medical conditions, your age, and other factors. HOME CARE INSTRUCTIONS  Have your blood pressure rechecked as directed by your health care provider.   Take medicines only as directed by your health care provider. Follow the directions carefully. Blood pressure medicines must be taken as prescribed. The medicine does not work as well when you skip doses. Skipping doses also puts you at risk for problems.  Do not smoke.   Monitor your blood pressure at home as directed by your health care provider. SEEK MEDICAL CARE IF:   You think you are having a reaction to medicines taken.  You have recurrent headaches or feel dizzy.  You have swelling in your ankles.  You have trouble with your vision. SEEK IMMEDIATE MEDICAL CARE IF:  You develop a severe headache or confusion.  You have unusual weakness, numbness, or feel faint.  You have severe chest or abdominal pain.  You vomit repeatedly.  You have trouble breathing. MAKE SURE YOU:   Understand these instructions.  Will watch your condition.  Will get help right away if you are not doing well or get worse.   This  information is not intended to replace advice given to you by your health care provider. Make sure you discuss any questions you have with your health care provider.   Document Released: 12/12/2005 Document Revised: 04/28/2015 Document Reviewed: 10/04/2013 Elsevier Interactive Patient Education [employer]

## 2016-06-03 NOTE — ED Notes (Signed)
An assessment nurse came by patient's home and realized bp elevated and directed patient to ucc.  Patient says he ran out of blood pressure medicine 2 weeks ago.  Patient denies pain or dizziness

## 2016-06-03 NOTE — ED Notes (Signed)
Security to take pt back to [facility] where car is located.

## 2016-06-03 NOTE — ED Provider Notes (Signed)
  Face-to-face evaluation   History: He presents for evaluation of untreated hypertension. He is out of his usual medications. His visiting nurse today, requested that he be seen and restarted on his usual medication. He was seen at an urgent care today, and he was sent here for further evaluation.  Physical exam: Alert, calm, cooperative. No respiratory distress. Extremities with very minimal IMA, bilaterally.  Medical screening examination/treatment/procedure(s) were conducted as a shared visit with non-physician practitioner(s) and myself.  I personally evaluated the patient during the encounter   Mancel Bale, MD 06/04/16 [phone]

## 2016-06-03 NOTE — ED Provider Notes (Signed)
CSN: [id number]     Arrival date & time 06/03/16  1503 History  By signing my name below, I, Placido Sou, attest that this documentation has been prepared under the direction and in the presence of [facility], PA-C. Electronically Signed: Placido Sou, ED Scribe. 06/03/2016. 4:19 PM.   Chief Complaint  Patient presents with  . Hypertension   The history is provided by the patient. No language interpreter was used.    HPI Comments: Jeremy Hall is a 70 y.o. male who presents to the Emergency Department from UC due to HTN. Pt states that he has been out of his triamterene and hydrochlorothiazide for 2 weeks and does not have an appointment until August 29th with his listed PCP to have his rx refilled. He reports mild BLE swelling x 2 days further noting that while at [facility] he was told he had mild chest congestion but denies SOB at rest or with exertion as well as CP, orthopnea, cough, weakness, numbness, visual changes and n/v/d. He confirms a hx of smoking. Denies any change from his normal state.     Past Medical History  Diagnosis Date  . Hypertension    History reviewed. No pertinent past surgical history. Family History  Problem Relation Age of Onset  . Cancer Mother    Social History  Substance Use Topics  . Smoking status: Current Every Day Smoker  . Smokeless tobacco: None  . Alcohol Use: No    Review of Systems  HENT: Positive for congestion.   Eyes: Negative for visual disturbance.  Respiratory: Negative for cough and shortness of breath.   Cardiovascular: Positive for leg swelling. Negative for chest pain.  Gastrointestinal: Negative for nausea, vomiting and diarrhea.  Neurological: Negative for weakness and numbness.  All other systems reviewed and are negative.   Allergies  Review of patient's allergies indicates no known allergies.  Home Medications   Prior to Admission medications   Medication Sig Start Date End Date Taking? Authorizing Provider   cyclobenzaprine (FLEXERIL) 10 MG tablet Take 1 tablet (10 mg total) by mouth 2 (two) times daily as needed for muscle spasms. 04/21/16   Tharon Aquas, PA  triamterene-hydrochlorothiazide (MAXZIDE) 75-50 MG tablet Take 1 tablet by mouth daily. 04/21/16   Tharon Aquas, PA   BP 164/127 mmHg  Pulse 66  Temp(Src) 97.5 F (36.4 C) (Oral)  Resp 18  Wt 244 lb 1 oz (110.706 kg)  SpO2 98%    Physical Exam  Constitutional: He appears well-developed and well-nourished. No distress.  HENT:  Head: Normocephalic and atraumatic.  Eyes: Conjunctivae are normal.  Neck: Normal range of motion. Neck supple.  Cardiovascular: Normal rate and regular rhythm.   Pulmonary/Chest: Effort normal. No respiratory distress. He has wheezes (mild, scattered). He has no rales.  Abdominal: He exhibits no distension.  Musculoskeletal: Normal range of motion. He exhibits edema.  Mild pitting edema to the BLE  Neurological: He is alert. He exhibits normal muscle tone.  Skin: He is not diaphoretic.  Psychiatric: He has a normal mood and affect. His behavior is normal.  Nursing note and vitals reviewed.  ED Course  Procedures  DIAGNOSTIC STUDIES: Oxygen Saturation is 98% on RA, normal by my interpretation.    COORDINATION OF CARE: 4:17 PM Discussed next steps with pt. Pt verbalized understanding and is agreeable with the plan.   Labs Review Labs Reviewed  I-STAT CHEM 8, ED    Imaging Review No results found. I have personally reviewed and  evaluated these lab results as part of my medical decision-making.   EKG Interpretation None      MDM   Final diagnoses:  Essential hypertension    Pt with hx HTN, out of his medication x 2 weeks, currently asymptomatic, advised to go to urgent care/ED by home health/insurance nurse.  Seen in urgent care and documented to have SOB and peripheral edema.  Pt denies any SOB to me though he does have very mild peripheral edema.   Pt also seen and examined by Dr  Effie Shy, with whom I discussed workup and plan.  Pt is asymptomatic, well appearing.  He does have scant peripheral edema that is symmetric.  He has a few scattered wheezes but is a smoker and denies any respiratory symptoms, this is likely chronic.  Counseled on smoking cessation > 5 minutes.  Chem 8 unremarkable.  Pt d/c home with refill of his prior blood pressure medication and advised to closely monitor BP, advised to follow DASH eating plan.  As pt is well appearing and denies any significant symptoms, will d/c home with prescriptions.  Dr Effie Shy in agreement with this plan.  PCP follow up.  Discussed result, findings, treatment, and follow up  with patient.  Pt given return precautions.  Pt verbalizes understanding and agrees with plan.       I personally performed the services described in this documentation, which was scribed in my presence. The recorded information has been reviewed and is accurate.   Trixie Dredge, PA-C 06/03/16 1728  Mancel Bale, MD 06/04/16 1610  Mancel Bale, MD 06/04/16 [phone]

## 2016-06-03 NOTE — ED Provider Notes (Addendum)
CSN: [id number]     Arrival date & time 06/03/16  1320 History   First MD Initiated Contact with Patient 06/03/16 1401     Chief Complaint  Patient presents with  . Hypertension   (Consider location/radiation/quality/duration/timing/severity/associated sxs/prior Treatment) Patient is a 70 y.o. male presenting with hypertension. The history is provided by the patient.  Hypertension This is a chronic problem. The current episode started more than 1 week ago (seen in april and given maxzide, no change in bp since with assoc edema, still smoking.). The problem has been gradually worsening. Associated symptoms include shortness of breath. Associated symptoms comments: Edema of legs and feet..    Past Medical History  Diagnosis Date  . Hypertension    History reviewed. No pertinent past surgical history. Family History  Problem Relation Age of Onset  . Cancer Mother    Social History  Substance Use Topics  . Smoking status: Current Every Day Smoker  . Smokeless tobacco: None  . Alcohol Use: No    Review of Systems  Constitutional: Negative.   Respiratory: Positive for shortness of breath.   Cardiovascular: Positive for leg swelling. Negative for palpitations.  All other systems reviewed and are negative.   Allergies  Review of patient's allergies indicates no known allergies.  Home Medications   Prior to Admission medications   Medication Sig Start Date End Date Taking? Authorizing Provider  triamterene-hydrochlorothiazide (MAXZIDE) 75-50 MG tablet Take 1 tablet by mouth daily. 04/21/16  Yes Tharon Aquas, PA  cyclobenzaprine (FLEXERIL) 10 MG tablet Take 1 tablet (10 mg total) by mouth 2 (two) times daily as needed for muscle spasms. 04/21/16   Tharon Aquas, PA   Meds Ordered and Administered this Visit  Medications - No data to display  BP 206/93 mmHg  Pulse 56  Temp(Src) 97.8 F (36.6 C) (Oral)  Resp 16  SpO2 97% No data found.   Physical Exam  Constitutional:  He is oriented to person, place, and time. He appears well-developed and well-nourished. No distress.  Neck: Normal range of motion. Neck supple.  Cardiovascular: Normal rate and intact distal pulses.   Murmur heard. Pulmonary/Chest: Effort normal and breath sounds normal.  Lymphadenopathy:    He has no cervical adenopathy.  Neurological: He is alert and oriented to person, place, and time.  Skin: Skin is warm and dry.  Nursing note and vitals reviewed.   ED Course  Procedures (including critical care time)  Labs Review Labs Reviewed - No data to display  Imaging Review No results found.   Visual Acuity Review  Right Eye Distance:   Left Eye Distance:   Bilateral Distance:    Right Eye Near:   Left Eye Near:    Bilateral Near:         MDM   1. Essential hypertension    Sent for further bp eval and care. Still smoking.    Linna Hoff, MD 06/03/16 1431  Linna Hoff, MD 06/03/16 [phone]

## 2016-06-22 DIAGNOSIS — Z23 Encounter for immunization: Secondary | ICD-10-CM | POA: Diagnosis not present

## 2016-06-22 DIAGNOSIS — F17210 Nicotine dependence, cigarettes, uncomplicated: Secondary | ICD-10-CM | POA: Diagnosis not present

## 2016-06-22 DIAGNOSIS — I10 Essential (primary) hypertension: Secondary | ICD-10-CM | POA: Diagnosis not present

## 2016-08-23 DIAGNOSIS — Z720 Tobacco use: Secondary | ICD-10-CM | POA: Diagnosis not present

## 2016-08-23 DIAGNOSIS — I10 Essential (primary) hypertension: Secondary | ICD-10-CM | POA: Diagnosis not present

## 2016-08-23 DIAGNOSIS — R739 Hyperglycemia, unspecified: Secondary | ICD-10-CM | POA: Diagnosis not present

## 2016-08-23 DIAGNOSIS — R918 Other nonspecific abnormal finding of lung field: Secondary | ICD-10-CM | POA: Diagnosis not present

## 2016-11-21 DIAGNOSIS — H2512 Age-related nuclear cataract, left eye: Secondary | ICD-10-CM | POA: Diagnosis not present

## 2016-11-21 DIAGNOSIS — H5201 Hypermetropia, right eye: Secondary | ICD-10-CM | POA: Diagnosis not present

## 2017-01-19 ENCOUNTER — Other Ambulatory Visit: Payer: Self-pay | Admitting: Internal Medicine

## 2017-01-19 DIAGNOSIS — Z0000 Encounter for general adult medical examination without abnormal findings: Secondary | ICD-10-CM | POA: Diagnosis not present

## 2017-01-19 DIAGNOSIS — F17210 Nicotine dependence, cigarettes, uncomplicated: Secondary | ICD-10-CM

## 2017-01-19 DIAGNOSIS — D72829 Elevated white blood cell count, unspecified: Secondary | ICD-10-CM | POA: Diagnosis not present

## 2017-01-19 DIAGNOSIS — E119 Type 2 diabetes mellitus without complications: Secondary | ICD-10-CM | POA: Diagnosis not present

## 2017-01-19 DIAGNOSIS — I10 Essential (primary) hypertension: Secondary | ICD-10-CM | POA: Diagnosis not present

## 2017-01-19 DIAGNOSIS — Z1389 Encounter for screening for other disorder: Secondary | ICD-10-CM | POA: Diagnosis not present

## 2017-01-27 ENCOUNTER — Ambulatory Visit
Admission: RE | Admit: 2017-01-27 | Discharge: 2017-01-27 | Disposition: A | Discharge status: Home / Self Care | Payer: Medicare HMO | Source: Ambulatory Visit | Attending: Internal Medicine | Admitting: Internal Medicine

## 2017-01-27 DIAGNOSIS — F17210 Nicotine dependence, cigarettes, uncomplicated: Secondary | ICD-10-CM

## 2017-02-04 DIAGNOSIS — Z1212 Encounter for screening for malignant neoplasm of rectum: Secondary | ICD-10-CM | POA: Diagnosis not present

## 2017-02-04 DIAGNOSIS — Z1211 Encounter for screening for malignant neoplasm of colon: Secondary | ICD-10-CM | POA: Diagnosis not present

## 2017-02-09 ENCOUNTER — Other Ambulatory Visit (HOSPITAL_COMMUNITY): Payer: Self-pay | Admitting: Internal Medicine

## 2017-02-09 DIAGNOSIS — R9389 Abnormal findings on diagnostic imaging of other specified body structures: Secondary | ICD-10-CM

## 2017-02-16 ENCOUNTER — Encounter (HOSPITAL_COMMUNITY): Payer: Medicare HMO

## 2017-02-22 ENCOUNTER — Other Ambulatory Visit: Payer: Self-pay | Admitting: Internal Medicine

## 2017-02-24 ENCOUNTER — Other Ambulatory Visit: Payer: Self-pay | Admitting: Internal Medicine

## 2017-02-24 ENCOUNTER — Other Ambulatory Visit: Payer: Self-pay | Admitting: Pediatrics

## 2017-02-24 DIAGNOSIS — E279 Disorder of adrenal gland, unspecified: Secondary | ICD-10-CM

## 2017-02-24 DIAGNOSIS — R911 Solitary pulmonary nodule: Secondary | ICD-10-CM

## 2017-02-24 DIAGNOSIS — E278 Other specified disorders of adrenal gland: Secondary | ICD-10-CM

## 2017-04-14 ENCOUNTER — Ambulatory Visit
Admission: RE | Admit: 2017-04-14 | Discharge: 2017-04-14 | Disposition: A | Discharge status: Home / Self Care | Payer: Medicare HMO | Source: Ambulatory Visit | Attending: Internal Medicine | Admitting: Internal Medicine

## 2017-04-14 DIAGNOSIS — E278 Other specified disorders of adrenal gland: Secondary | ICD-10-CM

## 2017-04-14 DIAGNOSIS — E279 Disorder of adrenal gland, unspecified: Secondary | ICD-10-CM

## 2017-04-14 DIAGNOSIS — F17210 Nicotine dependence, cigarettes, uncomplicated: Secondary | ICD-10-CM | POA: Diagnosis not present

## 2017-04-14 DIAGNOSIS — R911 Solitary pulmonary nodule: Secondary | ICD-10-CM

## 2017-04-14 DIAGNOSIS — R918 Other nonspecific abnormal finding of lung field: Secondary | ICD-10-CM | POA: Diagnosis not present

## 2017-08-08 ENCOUNTER — Other Ambulatory Visit: Payer: Self-pay | Admitting: Internal Medicine

## 2017-08-08 DIAGNOSIS — R911 Solitary pulmonary nodule: Secondary | ICD-10-CM

## 2017-08-08 DIAGNOSIS — E278 Other specified disorders of adrenal gland: Secondary | ICD-10-CM

## 2017-08-08 DIAGNOSIS — E119 Type 2 diabetes mellitus without complications: Secondary | ICD-10-CM | POA: Diagnosis not present

## 2017-08-08 DIAGNOSIS — E1165 Type 2 diabetes mellitus with hyperglycemia: Secondary | ICD-10-CM | POA: Diagnosis not present

## 2017-08-08 DIAGNOSIS — E279 Disorder of adrenal gland, unspecified: Secondary | ICD-10-CM | POA: Diagnosis not present

## 2017-08-08 DIAGNOSIS — I10 Essential (primary) hypertension: Secondary | ICD-10-CM | POA: Diagnosis not present

## 2017-08-08 DIAGNOSIS — I700 Atherosclerosis of aorta: Secondary | ICD-10-CM | POA: Diagnosis not present

## 2017-08-08 DIAGNOSIS — F17210 Nicotine dependence, cigarettes, uncomplicated: Secondary | ICD-10-CM | POA: Diagnosis not present

## 2017-08-08 DIAGNOSIS — M1990 Unspecified osteoarthritis, unspecified site: Secondary | ICD-10-CM | POA: Diagnosis not present

## 2017-08-21 DIAGNOSIS — E279 Disorder of adrenal gland, unspecified: Secondary | ICD-10-CM | POA: Diagnosis not present

## 2017-10-26 ENCOUNTER — Other Ambulatory Visit: Payer: Medicare HMO

## 2017-11-13 ENCOUNTER — Other Ambulatory Visit: Payer: Medicare HMO

## 2017-11-21 ENCOUNTER — Other Ambulatory Visit: Payer: Medicare HMO

## 2018-02-20 ENCOUNTER — Other Ambulatory Visit: Payer: Self-pay | Admitting: Internal Medicine

## 2018-02-20 DIAGNOSIS — I10 Essential (primary) hypertension: Secondary | ICD-10-CM | POA: Diagnosis not present

## 2018-02-20 DIAGNOSIS — I739 Peripheral vascular disease, unspecified: Secondary | ICD-10-CM | POA: Diagnosis not present

## 2018-02-20 DIAGNOSIS — F17200 Nicotine dependence, unspecified, uncomplicated: Secondary | ICD-10-CM | POA: Diagnosis not present

## 2018-02-20 DIAGNOSIS — E1151 Type 2 diabetes mellitus with diabetic peripheral angiopathy without gangrene: Secondary | ICD-10-CM | POA: Diagnosis not present

## 2018-02-20 DIAGNOSIS — Z7189 Other specified counseling: Secondary | ICD-10-CM | POA: Diagnosis not present

## 2018-02-20 DIAGNOSIS — Z1389 Encounter for screening for other disorder: Secondary | ICD-10-CM | POA: Diagnosis not present

## 2018-02-20 DIAGNOSIS — Z0000 Encounter for general adult medical examination without abnormal findings: Secondary | ICD-10-CM | POA: Diagnosis not present

## 2018-02-20 DIAGNOSIS — Z23 Encounter for immunization: Secondary | ICD-10-CM | POA: Diagnosis not present

## 2018-02-20 DIAGNOSIS — R911 Solitary pulmonary nodule: Secondary | ICD-10-CM | POA: Diagnosis not present

## 2018-02-20 DIAGNOSIS — E279 Disorder of adrenal gland, unspecified: Secondary | ICD-10-CM | POA: Diagnosis not present

## 2018-02-20 DIAGNOSIS — I700 Atherosclerosis of aorta: Secondary | ICD-10-CM | POA: Diagnosis not present

## 2018-02-21 ENCOUNTER — Other Ambulatory Visit: Payer: Self-pay | Admitting: Internal Medicine

## 2018-02-21 DIAGNOSIS — E278 Other specified disorders of adrenal gland: Secondary | ICD-10-CM

## 2018-03-07 ENCOUNTER — Other Ambulatory Visit: Payer: Medicare HMO

## 2018-04-06 ENCOUNTER — Ambulatory Visit
Admission: RE | Admit: 2018-04-06 | Discharge: 2018-04-06 | Disposition: A | Discharge status: Home / Self Care | Payer: Medicare HMO | Source: Ambulatory Visit | Attending: Internal Medicine | Admitting: Internal Medicine

## 2018-04-06 DIAGNOSIS — E278 Other specified disorders of adrenal gland: Secondary | ICD-10-CM | POA: Diagnosis not present

## 2018-04-06 MED ORDER — IOPAMIDOL (ISOVUE-300) INJECTION 61%
100.0000 mL | Freq: Once | INTRAVENOUS | Status: AC | PRN
  Administered 2018-04-06: 100 mL via INTRAVENOUS

## 2018-04-23 ENCOUNTER — Other Ambulatory Visit: Payer: Self-pay | Admitting: Internal Medicine

## 2018-04-23 DIAGNOSIS — R911 Solitary pulmonary nodule: Secondary | ICD-10-CM

## 2018-04-25 ENCOUNTER — Other Ambulatory Visit: Payer: Medicare HMO

## 2018-09-17 DIAGNOSIS — I700 Atherosclerosis of aorta: Secondary | ICD-10-CM | POA: Diagnosis not present

## 2018-09-17 DIAGNOSIS — E1151 Type 2 diabetes mellitus with diabetic peripheral angiopathy without gangrene: Secondary | ICD-10-CM | POA: Diagnosis not present

## 2018-09-17 DIAGNOSIS — Z23 Encounter for immunization: Secondary | ICD-10-CM | POA: Diagnosis not present

## 2018-09-17 DIAGNOSIS — E279 Disorder of adrenal gland, unspecified: Secondary | ICD-10-CM | POA: Diagnosis not present

## 2018-09-17 DIAGNOSIS — I10 Essential (primary) hypertension: Secondary | ICD-10-CM | POA: Diagnosis not present

## 2018-09-17 DIAGNOSIS — R911 Solitary pulmonary nodule: Secondary | ICD-10-CM | POA: Diagnosis not present

## 2018-09-17 DIAGNOSIS — F17200 Nicotine dependence, unspecified, uncomplicated: Secondary | ICD-10-CM | POA: Diagnosis not present

## 2018-09-17 DIAGNOSIS — I739 Peripheral vascular disease, unspecified: Secondary | ICD-10-CM | POA: Diagnosis not present

## 2018-10-02 ENCOUNTER — Ambulatory Visit
Admission: RE | Admit: 2018-10-02 | Discharge: 2018-10-02 | Disposition: A | Discharge status: Home / Self Care | Payer: Medicare HMO | Source: Ambulatory Visit | Attending: Internal Medicine | Admitting: Internal Medicine

## 2018-10-02 DIAGNOSIS — Z87891 Personal history of nicotine dependence: Secondary | ICD-10-CM | POA: Diagnosis not present

## 2018-10-02 DIAGNOSIS — R911 Solitary pulmonary nodule: Secondary | ICD-10-CM

## 2018-11-02 DIAGNOSIS — H2513 Age-related nuclear cataract, bilateral: Secondary | ICD-10-CM | POA: Diagnosis not present

## 2018-11-02 DIAGNOSIS — H5201 Hypermetropia, right eye: Secondary | ICD-10-CM | POA: Diagnosis not present

## 2019-07-01 DIAGNOSIS — E119 Type 2 diabetes mellitus without complications: Secondary | ICD-10-CM | POA: Diagnosis not present

## 2019-07-01 DIAGNOSIS — Z0000 Encounter for general adult medical examination without abnormal findings: Secondary | ICD-10-CM | POA: Diagnosis not present

## 2019-07-01 DIAGNOSIS — Z1389 Encounter for screening for other disorder: Secondary | ICD-10-CM | POA: Diagnosis not present

## 2019-10-28 DIAGNOSIS — J449 Chronic obstructive pulmonary disease, unspecified: Secondary | ICD-10-CM | POA: Diagnosis not present

## 2019-10-28 DIAGNOSIS — R911 Solitary pulmonary nodule: Secondary | ICD-10-CM | POA: Diagnosis not present

## 2019-10-28 DIAGNOSIS — F17200 Nicotine dependence, unspecified, uncomplicated: Secondary | ICD-10-CM | POA: Diagnosis not present

## 2019-10-28 DIAGNOSIS — I10 Essential (primary) hypertension: Secondary | ICD-10-CM | POA: Diagnosis not present

## 2019-10-28 DIAGNOSIS — E1151 Type 2 diabetes mellitus with diabetic peripheral angiopathy without gangrene: Secondary | ICD-10-CM | POA: Diagnosis not present

## 2019-10-28 DIAGNOSIS — I700 Atherosclerosis of aorta: Secondary | ICD-10-CM | POA: Diagnosis not present

## 2019-10-28 DIAGNOSIS — I739 Peripheral vascular disease, unspecified: Secondary | ICD-10-CM | POA: Diagnosis not present

## 2019-10-31 ENCOUNTER — Other Ambulatory Visit: Payer: Self-pay | Admitting: Internal Medicine

## 2019-10-31 DIAGNOSIS — R911 Solitary pulmonary nodule: Secondary | ICD-10-CM

## 2019-11-25 ENCOUNTER — Inpatient Hospital Stay: Admission: RE | Admit: 2019-11-25 | Payer: Medicare HMO | Source: Ambulatory Visit

## 2019-12-10 ENCOUNTER — Ambulatory Visit
Admission: RE | Admit: 2019-12-10 | Discharge: 2019-12-10 | Disposition: A | Discharge status: Home / Self Care | Payer: Medicare HMO | Source: Ambulatory Visit | Attending: Internal Medicine | Admitting: Internal Medicine

## 2019-12-10 ENCOUNTER — Other Ambulatory Visit: Payer: Self-pay

## 2019-12-10 DIAGNOSIS — R911 Solitary pulmonary nodule: Secondary | ICD-10-CM

## 2019-12-10 DIAGNOSIS — F17210 Nicotine dependence, cigarettes, uncomplicated: Secondary | ICD-10-CM | POA: Diagnosis not present

## 2020-02-26 DIAGNOSIS — I739 Peripheral vascular disease, unspecified: Secondary | ICD-10-CM | POA: Diagnosis not present

## 2020-02-26 DIAGNOSIS — I10 Essential (primary) hypertension: Secondary | ICD-10-CM | POA: Diagnosis not present

## 2020-02-26 DIAGNOSIS — E1151 Type 2 diabetes mellitus with diabetic peripheral angiopathy without gangrene: Secondary | ICD-10-CM | POA: Diagnosis not present

## 2020-02-26 DIAGNOSIS — R9389 Abnormal findings on diagnostic imaging of other specified body structures: Secondary | ICD-10-CM | POA: Diagnosis not present

## 2020-03-04 ENCOUNTER — Institutional Professional Consult (permissible substitution): Payer: Medicare HMO | Admitting: Internal Medicine

## 2020-03-24 DIAGNOSIS — E1151 Type 2 diabetes mellitus with diabetic peripheral angiopathy without gangrene: Secondary | ICD-10-CM | POA: Diagnosis not present

## 2020-03-24 DIAGNOSIS — I739 Peripheral vascular disease, unspecified: Secondary | ICD-10-CM | POA: Diagnosis not present

## 2020-03-30 ENCOUNTER — Encounter: Payer: Self-pay | Admitting: Internal Medicine

## 2020-03-30 ENCOUNTER — Other Ambulatory Visit: Payer: Self-pay

## 2020-03-30 ENCOUNTER — Ambulatory Visit: Payer: Medicare HMO | Admitting: Internal Medicine

## 2020-03-30 ENCOUNTER — Institutional Professional Consult (permissible substitution): Payer: Medicare HMO | Admitting: Internal Medicine

## 2020-03-30 VITALS — BP 138/78 | HR 70 | Temp 97.5°F | Ht 71.5 in | Wt 243.6 lb

## 2020-03-30 DIAGNOSIS — R911 Solitary pulmonary nodule: Secondary | ICD-10-CM

## 2020-03-30 DIAGNOSIS — IMO0001 Reserved for inherently not codable concepts without codable children: Secondary | ICD-10-CM

## 2020-03-30 DIAGNOSIS — J449 Chronic obstructive pulmonary disease, unspecified: Secondary | ICD-10-CM | POA: Diagnosis not present

## 2020-03-30 DIAGNOSIS — F17210 Nicotine dependence, cigarettes, uncomplicated: Secondary | ICD-10-CM | POA: Diagnosis not present

## 2020-03-30 MED ORDER — NICOTINE 14 MG/24HR TD PT24: 14.0000 mg | MEDICATED_PATCH | Freq: Every day | TRANSDERMAL | 1 refills | Status: DC

## 2020-03-30 MED ORDER — TRELEGY ELLIPTA 100-62.5-25 MCG/INH IN AEPB: 1.0000 | INHALATION_SPRAY | Freq: Every day | RESPIRATORY_TRACT | 0 refills | Status: AC

## 2020-03-30 MED ORDER — TRELEGY ELLIPTA 100-62.5-25 MCG/INH IN AEPB: 1.0000 | INHALATION_SPRAY | Freq: Every day | RESPIRATORY_TRACT | 11 refills | Status: DC

## 2020-03-30 NOTE — Progress Notes (Signed)
Synopsis: DOE  Assessment & Plan:  Problem 1 COPD:  Problem 2 Abnormal CT chest: progressive RML changes look more inflammatory than anything else, should monitor but low threshold for bronchoscopy if continues to progress Problem 3 Smoker:  - Repeat CT chest - Smoking cessation counseling x 5 minutes, nicotine patches prescribed, patient motivated to quit - Trelegy trial, fill is (A) helps, and (B) is affordable - PFTs - f/u in 2 months, will review CT chest and see if we need to do bronch  MDM . I reviewed prior external note(s) from Dr. Valentina Lucks on 11/12/2019 . I reviewed the result(s) of BMP 06/03/2016 . I have ordered repeat CT, nicotine patches, trelegy, PFTs  Review of patient's 12/10/2019 CT images revealed progressive RML consolidation and scattered inflammatory appearing nodules. The patient's images have been independently reviewed by me.     End of visit medications:  Current Outpatient Medications:  .  acetaminophen (TYLENOL) 500 MG tablet, Take 500 mg by mouth every 6 (six) hours as needed., Disp: , Rfl:  .  aspirin EC 81 MG tablet, Take 81 mg by mouth daily., Disp: , Rfl:  .  PRAVASTATIN SODIUM PO, Take by mouth daily., Disp: , Rfl:  .  VALSARTAN PO, Take by mouth daily., Disp: , Rfl:  .  Fluticasone-Umeclidin-Vilant (TRELEGY ELLIPTA) 100-62.5-25 MCG/INH AEPB, Inhale 1 puff into the lungs daily at 6 (six) AM., Disp: 30 each, Rfl: 11 .  nicotine (NICODERM CQ - DOSED IN MG/24 HOURS) 14 mg/24hr patch, Place 1 patch (14 mg total) onto the skin daily., Disp: 28 patch, Rfl: 1   Lorin Glass, MD [facility] 03/30/2020 4:00 PM    Subjective:   PATIENT ID: Jeremy Hall GENDER: male DOB: 01-08-1946, MRN: [id number]  Chief Complaint  Patient presents with  . Consult    referred by Dr Valentina Lucks, sob with exertion, productive cough in morning, clear phlegm, occ brown phlegm    HPI Here for abnormal imaging and DOE About 50 pack year smoker  presenting with increasing DOE and cough. Cough worse in AM, mostly clear phlegm. MMRC 1 dyspnea. Daily wheezing. Still smoking but trying to cut down. Also has LDCT imaging that is abnormal (see A/P). No weight changes. No hospitalizations for breathing Denies aspiration symptoms Denies GERD, sinus, or allergy symptoms  Ancillary information including prior medications, full medical/surgical/family/social histoies, and PFTs (when available) are listed below and have been reviewed.   ROS + symptoms in bold Fevers, chills, weight loss Nausea, vomiting, diarrhea Shortness of breath, wheezing, cough Chest pain, palpitations, lower ext edema   Objective:   Vitals:   03/30/20 1545  BP: 138/78  Pulse: 70  Temp: (!) 97.5 F (36.4 C)  TempSrc: Temporal  SpO2: 98%  Weight: 243 lb 9.6 oz (110.5 kg)  Height: 5' 11.5" (1.816 m)   98% on RA BMI Readings from Last 3 Encounters:  03/30/20 33.50 kg/m   Wt Readings from Last 3 Encounters:  03/30/20 243 lb 9.6 oz (110.5 kg)  06/03/16 244 lb 1 oz (110.7 kg)    GEN: 74 year old man in no acute distress HEENT: trachea midline, mucus membranes moist CV: Regular rate and rhythm, extremities are warm PULM: Wheezing bilaterally without accessory muscle use GI: Soft, +BS EXT: No edema or clubbing NEURO: Moves all 4 extremities   Ancillary Information    Past Medical History:  Diagnosis Date  . Hypertension      Family History  Problem Relation Age of  Onset  . Cancer Mother      No past surgical history on file.  Social History   Socioeconomic History  . Marital status: Divorced    Spouse name: Not on file  . Number of children: Not on file  . Years of education: Not on file  . Highest education level: Not on file  Occupational History  . Not on file  Tobacco Use  . Smoking status: Current Every Day Smoker    Packs/day: 0.50    Years: 35.00    Pack years: 17.50    Types: Cigarettes  . Smokeless tobacco: Never  Used  Substance and Sexual Activity  . Alcohol use: No  . Drug use: Not on file  . Sexual activity: Not on file  Other Topics Concern  . Not on file  Social History Narrative  . Not on file   Social Determinants of Health   Financial Resource Strain:   . Difficulty of Paying Living Expenses:   Food Insecurity:   . Worried About Charity fundraiser in the Last Year:   . Arboriculturist in the Last Year:   Transportation Needs:   . Film/video editor (Medical):   Marland Kitchen Lack of Transportation (Non-Medical):   Physical Activity:   . Days of Exercise per Week:   . Minutes of Exercise per Session:   Stress:   . Feeling of Stress :   Social Connections:   . Frequency of Communication with Friends and Family:   . Frequency of Social Gatherings with Friends and Family:   . Attends Religious Services:   . Active Member of Clubs or Organizations:   . Attends Archivist Meetings:   Marland Kitchen Marital Status:   Intimate Partner Violence:   . Fear of Current or Ex-Partner:   . Emotionally Abused:   Marland Kitchen Physically Abused:   . Sexually Abused:      No Known Allergies   CBC    Component Value Date/Time   HGB 16.7 06/03/2016 1528   HCT 49.0 06/03/2016 1528    Pulmonary Functions Testing Results: No flowsheet data found.  Outpatient Medications Prior to Visit  Medication Sig Dispense Refill  . acetaminophen (TYLENOL) 500 MG tablet Take 500 mg by mouth every 6 (six) hours as needed.    Marland Kitchen aspirin EC 81 MG tablet Take 81 mg by mouth daily.    Marland Kitchen PRAVASTATIN SODIUM PO Take by mouth daily.    Marland Kitchen VALSARTAN PO Take by mouth daily.    . cyclobenzaprine (FLEXERIL) 10 MG tablet Take 1 tablet (10 mg total) by mouth 2 (two) times daily as needed for muscle spasms. 20 tablet 0  . triamterene-hydrochlorothiazide (MAXZIDE) 75-50 MG tablet Take 1 tablet by mouth daily. 30 tablet 1   No facility-administered medications prior to visit.

## 2020-03-30 NOTE — Patient Instructions (Addendum)
-   CT scan chest - Lung function tests - Stop smoking, use nicotine patches to help - Try trelegy, fill script if it helps and is affordable - Come see me in 2 months or sooner if feeling unwell

## 2020-04-03 ENCOUNTER — Inpatient Hospital Stay: Admission: RE | Admit: 2020-04-03 | Payer: Medicare HMO | Source: Ambulatory Visit

## 2020-04-09 ENCOUNTER — Other Ambulatory Visit: Payer: Self-pay

## 2020-04-09 ENCOUNTER — Ambulatory Visit (INDEPENDENT_AMBULATORY_CARE_PROVIDER_SITE_OTHER)
Admission: RE | Admit: 2020-04-09 | Discharge: 2020-04-09 | Disposition: A | Discharge status: Home / Self Care | Payer: Medicare HMO | Source: Ambulatory Visit | Attending: Internal Medicine | Admitting: Internal Medicine

## 2020-04-09 DIAGNOSIS — J439 Emphysema, unspecified: Secondary | ICD-10-CM | POA: Diagnosis not present

## 2020-04-09 DIAGNOSIS — R911 Solitary pulmonary nodule: Secondary | ICD-10-CM | POA: Diagnosis not present

## 2020-04-09 DIAGNOSIS — IMO0001 Reserved for inherently not codable concepts without codable children: Secondary | ICD-10-CM

## 2020-04-10 ENCOUNTER — Telehealth: Payer: Self-pay | Admitting: Internal Medicine

## 2020-04-10 ENCOUNTER — Other Ambulatory Visit (HOSPITAL_COMMUNITY)
Admission: RE | Admit: 2020-04-10 | Discharge: 2020-04-10 | Disposition: A | Discharge status: Home / Self Care | Payer: Medicare HMO | Source: Ambulatory Visit | Attending: Internal Medicine | Admitting: Internal Medicine

## 2020-04-10 DIAGNOSIS — Z20822 Contact with and (suspected) exposure to covid-19: Secondary | ICD-10-CM | POA: Diagnosis not present

## 2020-04-10 DIAGNOSIS — Z01812 Encounter for preprocedural laboratory examination: Secondary | ICD-10-CM | POA: Insufficient documentation

## 2020-04-10 LAB — SARS CORONAVIRUS 2 (TAT 6-24 HRS): SARS Coronavirus 2: NEGATIVE

## 2020-04-10 NOTE — Telephone Encounter (Signed)
Pt. Is scheduled to see [name]. Thanks

## 2020-04-10 NOTE — Telephone Encounter (Signed)
Received call report from [facility] on patient's Low Dose CT scan done on 04/09/2020. Sarah please review the result/impression copied below:  IMPRESSION: 1. Lung-RADS 4BS, suspicious. PET-CT is strongly recommended in the near future to better evaluate the nodule associated with the left major fissure. 2. The "S" modifier above refers to potentially clinically significant non lung cancer related findings. Specifically, there is aortic atherosclerosis, in addition to 2 vessel coronary artery disease. Assessment for potential risk factor modification, dietary therapy or pharmacologic therapy may be warranted, if clinically indicated. 3. There are calcifications of the aortic valve. Echocardiographic correlation for evaluation of potential valvular dysfunction may be warranted if clinically indicated. 4. Diffuse bronchial wall thickening with mild centrilobular and paraseptal emphysema; imaging findings suggestive of underlying COPD.   Please advise, thank you.

## 2020-04-13 ENCOUNTER — Other Ambulatory Visit: Payer: Self-pay

## 2020-04-13 ENCOUNTER — Ambulatory Visit (INDEPENDENT_AMBULATORY_CARE_PROVIDER_SITE_OTHER): Payer: Medicare HMO | Admitting: Internal Medicine

## 2020-04-13 DIAGNOSIS — J449 Chronic obstructive pulmonary disease, unspecified: Secondary | ICD-10-CM | POA: Diagnosis not present

## 2020-04-13 LAB — PULMONARY FUNCTION TEST
DL/VA % pred: 103 %
DL/VA: 4.14 ml/min/mmHg/L
DLCO cor % pred: 81 %
DLCO cor: 21.83 ml/min/mmHg
DLCO unc % pred: 81 %
DLCO unc: 21.83 ml/min/mmHg
FEF 25-75 Post: 1.65 L/sec
FEF 25-75 Pre: 1.15 L/sec
FEF2575-%Change-Post: 43 %
FEF2575-%Pred-Post: 64 %
FEF2575-%Pred-Pre: 45 %
FEV1-%Change-Post: 12 %
FEV1-%Pred-Post: 70 %
FEV1-%Pred-Pre: 62 %
FEV1-Post: 2.14 L
FEV1-Pre: 1.9 L
FEV1FVC-%Change-Post: 1 %
FEV1FVC-%Pred-Pre: 87 %
FEV6-%Change-Post: 9 %
FEV6-%Pred-Post: 81 %
FEV6-%Pred-Pre: 74 %
FEV6-Post: 3.13 L
FEV6-Pre: 2.85 L
FEV6FVC-%Change-Post: 0 %
FEV6FVC-%Pred-Post: 103 %
FEV6FVC-%Pred-Pre: 104 %
FVC-%Change-Post: 10 %
FVC-%Pred-Post: 78 %
FVC-%Pred-Pre: 71 %
FVC-Post: 3.16 L
FVC-Pre: 2.86 L
Post FEV1/FVC ratio: 68 %
Post FEV6/FVC ratio: 99 %
Pre FEV1/FVC ratio: 66 %
Pre FEV6/FVC Ratio: 100 %
RV % pred: 136 %
RV: 3.52 L
TLC % pred: 91 %
TLC: 6.68 L

## 2020-04-13 NOTE — Progress Notes (Signed)
Full PFT performed today. °
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
## 2020-04-15 ENCOUNTER — Ambulatory Visit: Payer: Medicare HMO | Admitting: Pulmonary Disease

## 2020-04-15 ENCOUNTER — Other Ambulatory Visit: Payer: Self-pay

## 2020-04-15 ENCOUNTER — Encounter: Payer: Self-pay | Admitting: Pulmonary Disease

## 2020-04-15 VITALS — BP 136/68 | HR 69 | Temp 97.9°F | Ht 72.0 in | Wt 247.0 lb

## 2020-04-15 DIAGNOSIS — Z87891 Personal history of nicotine dependence: Secondary | ICD-10-CM | POA: Insufficient documentation

## 2020-04-15 DIAGNOSIS — F17210 Nicotine dependence, cigarettes, uncomplicated: Secondary | ICD-10-CM | POA: Diagnosis not present

## 2020-04-15 DIAGNOSIS — J449 Chronic obstructive pulmonary disease, unspecified: Secondary | ICD-10-CM | POA: Insufficient documentation

## 2020-04-15 DIAGNOSIS — F17200 Nicotine dependence, unspecified, uncomplicated: Secondary | ICD-10-CM | POA: Insufficient documentation

## 2020-04-15 DIAGNOSIS — R918 Other nonspecific abnormal finding of lung field: Secondary | ICD-10-CM | POA: Diagnosis not present

## 2020-04-15 DIAGNOSIS — Z0000 Encounter for general adult medical examination without abnormal findings: Secondary | ICD-10-CM | POA: Insufficient documentation

## 2020-04-15 MED ORDER — BUPROPION HCL ER (XL) 150 MG PO TB24: ORAL_TABLET | ORAL | 1 refills | Status: DC

## 2020-04-15 NOTE — Assessment & Plan Note (Signed)
Emphasized importance on stopping smoking  Plan: Wellbutrin sent today 1 week follow-up with clinical pharmacy team to further review as well as emphasized smoking cessation Patient needs Pneumovax 23

## 2020-04-15 NOTE — Assessment & Plan Note (Signed)
Plan: Emphasized need to stop smoking Continue Trelegy Ellipta Patient needs Pneumovax 23

## 2020-04-15 NOTE — Assessment & Plan Note (Signed)
Abnormal lung cancer screening CT lung RADS 4 BS Current smoker  Plan: PET scan ordered 2-week follow-up with Dr. Katrinka Blazing or myself Emphasized need to stop smoking

## 2020-04-15 NOTE — Patient Instructions (Addendum)
You were seen today by Coral Ceo, NP  for:   1. Abnormal findings on diagnostic imaging of lung  - NM PET Image Initial (PI) Skull Base To Thigh; Future  2. Former smoker  Start Wellbutrin 150 mg tablet >>>Take one 150 mg tablet daily for 3 days, then increase to 150 mg twice daily >>>start this medication 1 to 2 weeks before your quit date, we recommend abruptly quitting smoking while on this medication.  There is an option to do a gradual smoking reduction to reduce smoking by 50% by week 4 and another 50% by week 8 and to come to completely stop smoking by week 12.  We recommend that you stop smoking.  >>>You need to set a quit date >>>If you have friends or family who smoke, let them know you are trying to quit and not to smoke around you or in your living environment  Smoking Cessation Resources:  1 800 QUIT NOW  >>> Patient to call this resource and utilize it to help support her quit smoking >>> Keep up your hard work with stopping smoking  You can also contact the [facility] >>>For smoking cessation classes call [phone]  We do not recommend using e-cigarettes as a form of stopping smoking  You can sign up for smoking cessation support texts and information:  >>>https://smokefree.gov/smokefreetxt  Please present to our office in 1 weeks for an appointment with the clinical pharmacy team for:  . Smoking cessation . Medication reconciliation  . Inhaler teaching    3. COPD mixed type (HCC)  Trelegy Ellipta  >>> 1 puff daily in the morning >>>rinse mouth out after use  >>> This inhaler contains 3 medications that help manage her respiratory status, contact our office if you cannot afford this medication or unable to remain on this medication  Note your daily symptoms > remember "red flags" for COPD:   >>>Increase in cough >>>increase in sputum production >>>increase in shortness of breath or activity  intolerance.   If you notice these  symptoms, please call the office to be seen.     We recommend today:  Orders Placed This Encounter  Procedures  . NM PET Image Initial (PI) Skull Base To Thigh    Standing Status:   Future    Standing Expiration Date:   06/15/2021    Scheduling Instructions:     As soon as possible    Order Specific Question:   If indicated for the ordered procedure, I authorize the administration of a radiopharmaceutical per Radiology protocol    Answer:   Yes    Order Specific Question:   Preferred imaging location?    Answer:   [facility]    Order Specific Question:   Release to patient    Answer:   Immediate    Order Specific Question:   Radiology Contrast Protocol - do NOT remove file path    Answer:   [email or web address]   Orders Placed This Encounter  Procedures  . NM PET Image Initial (PI) Skull Base To Thigh   Meds ordered this encounter  Medications  . buPROPion (WELLBUTRIN XL) 150 MG 24 hr tablet    Sig: Take one 150 mg tablet daily for 3 days, then increase to 150 mg twice daily    Dispense:  60 tablet    Refill:  1    Follow Up:    Return in about 2 weeks (around 04/29/2020), or if symptoms worsen or fail to improve, for  Follow up with Dr. Tamala Julian, Follow up with Wyn Quaker FNP-C.  Please present to our office in 1 weeks for an appointment with the clinical pharmacy team for:  . Smoking cessation . Medication reconciliation  . Inhaler teaching    Please do your part to reduce the spread of COVID-19:      Reduce your risk of any infection  and COVID19 by using the similar precautions used for avoiding the common cold or flu:  Marland Kitchen Wash your hands often with soap and warm water for at least 20 seconds.  If soap and water are not readily available, use an alcohol-based hand sanitizer with at least 60% alcohol.  . If coughing or sneezing, cover your mouth and nose by coughing or sneezing into the elbow areas of your shirt or coat, into a tissue or into  your sleeve (not your hands). Langley Gauss A MASK when in public  . Avoid shaking hands with others and consider head nods or verbal greetings only. . Avoid touching your eyes, nose, or mouth with unwashed hands.  . Avoid close contact with people who are sick. . Avoid places or events with large numbers of people in one location, like concerts or sporting events. . If you have some symptoms but not all symptoms, continue to monitor at home and seek medical attention if your symptoms worsen. . If you are having a medical emergency, call 911.   [place] / e-Visit: [email or web address]         [facility]: [facility]: [phone]                   [facility]: [phone]     It is flu season:   >>> Best ways to protect herself from the flu: Receive the yearly flu vaccine, practice good hand hygiene washing with soap and also using hand sanitizer when available, eat a nutritious meals, get adequate rest, hydrate appropriately   Please contact the office if your symptoms worsen or you have concerns that you are not improving.   Thank you for choosing [facility] for your healthcare, and for allowing [place] to partner with you on your healthcare journey. I am thankful to be able to provide care to you today.   Wyn Quaker FNP-C

## 2020-04-15 NOTE — Assessment & Plan Note (Signed)
Plan: Patient is due for Pneumovax 23 Would recommend Covid vaccine the patient

## 2020-04-22 ENCOUNTER — Other Ambulatory Visit: Payer: Medicare HMO

## 2020-04-23 ENCOUNTER — Encounter (HOSPITAL_COMMUNITY): Admission: RE | Admit: 2020-04-23 | Payer: Medicare HMO | Source: Ambulatory Visit

## 2020-04-29 ENCOUNTER — Telehealth: Payer: Self-pay | Admitting: Pulmonary Disease

## 2020-04-29 ENCOUNTER — Ambulatory Visit: Payer: Medicare HMO | Admitting: Pulmonary Disease

## 2020-04-29 NOTE — Telephone Encounter (Signed)
04/29/2020  Contacted patient today as he was scheduled for a office follow-up.  Unfortunately patient has not completed PET scan yet.  Looks like patient has had difficulty getting this completed and has had to have this rescheduled a couple of times.  He is scheduled for 05/05/2020.  I emphasized the importance of the patient that he have this imaging done.  We will move our follow-up with him to 05/06/2020 at 11:30 AM.  Patient reports that his breathing has improved since last being seen.  He has no current acute complaints.  He agrees with this plan moving forward.  He understands that he needs to complete the PET scan as scheduled.  We'll route to Dr. Katrinka Blazing as Lorain Childes.  Elisha Headland, FNP

## 2020-05-05 ENCOUNTER — Other Ambulatory Visit: Payer: Self-pay

## 2020-05-05 ENCOUNTER — Encounter (HOSPITAL_COMMUNITY)
Admission: RE | Admit: 2020-05-05 | Discharge: 2020-05-05 | Disposition: A | Discharge status: Home / Self Care | Payer: Medicare HMO | Source: Ambulatory Visit | Attending: Pulmonary Disease | Admitting: Pulmonary Disease

## 2020-05-05 DIAGNOSIS — Z87891 Personal history of nicotine dependence: Secondary | ICD-10-CM | POA: Insufficient documentation

## 2020-05-05 DIAGNOSIS — K118 Other diseases of salivary glands: Secondary | ICD-10-CM | POA: Diagnosis not present

## 2020-05-05 DIAGNOSIS — R911 Solitary pulmonary nodule: Secondary | ICD-10-CM | POA: Diagnosis not present

## 2020-05-05 DIAGNOSIS — E278 Other specified disorders of adrenal gland: Secondary | ICD-10-CM | POA: Diagnosis not present

## 2020-05-05 DIAGNOSIS — R918 Other nonspecific abnormal finding of lung field: Secondary | ICD-10-CM | POA: Insufficient documentation

## 2020-05-05 LAB — GLUCOSE, CAPILLARY: Glucose-Capillary: 117 mg/dL — ABNORMAL HIGH (ref 70–99)

## 2020-05-05 MED ORDER — FLUDEOXYGLUCOSE F - 18 (FDG) INJECTION
12.3700 | Freq: Once | INTRAVENOUS | Status: AC | PRN
  Administered 2020-05-05: 09:00:00 12.37 via INTRAVENOUS

## 2020-05-05 NOTE — Progress Notes (Signed)
Nodule is PET positive.  Will review with patient in office on 05/06/2020.Elisha Headland, FNP

## 2020-05-06 ENCOUNTER — Encounter: Payer: Self-pay | Admitting: Pulmonary Disease

## 2020-05-06 ENCOUNTER — Ambulatory Visit: Payer: Medicare HMO | Admitting: Pulmonary Disease

## 2020-05-06 VITALS — BP 142/82 | HR 68 | Temp 97.2°F | Ht 66.5 in | Wt 241.2 lb

## 2020-05-06 DIAGNOSIS — R918 Other nonspecific abnormal finding of lung field: Secondary | ICD-10-CM

## 2020-05-06 DIAGNOSIS — F17200 Nicotine dependence, unspecified, uncomplicated: Secondary | ICD-10-CM | POA: Diagnosis not present

## 2020-05-06 DIAGNOSIS — J449 Chronic obstructive pulmonary disease, unspecified: Secondary | ICD-10-CM

## 2020-05-06 DIAGNOSIS — Z0000 Encounter for general adult medical examination without abnormal findings: Secondary | ICD-10-CM

## 2020-05-06 DIAGNOSIS — F17210 Nicotine dependence, cigarettes, uncomplicated: Secondary | ICD-10-CM | POA: Diagnosis not present

## 2020-05-06 NOTE — Assessment & Plan Note (Signed)
At next follow-up patient should receive Pneumovax 23
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
## 2020-05-06 NOTE — Assessment & Plan Note (Signed)
Emphasized need to stop smoking Continue Trelegy Ellipta We will set up follow-up with clinical pharmacy team 44-month follow-up with our office

## 2020-05-06 NOTE — Assessment & Plan Note (Signed)
Emphasized need to stop smoking Continue Wellbutrin We will coordinate close follow-up with clinical pharmacy team

## 2020-05-06 NOTE — Patient Instructions (Addendum)
You were seen today by Coral Ceo, NP  for:   1. Abnormal findings on diagnostic imaging of lung  - CT Super D Chest Wo Contrast; Future  We will plan on repeating the CT super D of your chest in August/2021  If nodules showing growth that is why we may need to consider tissue biopsy  Please notify our office if you have increased fatigue, coughing up blood, or increased weight loss  2. Current smoker  We recommend that you stop smoking.  >>>You need to set a quit date >>>If you have friends or family who smoke, let them know you are trying to quit and not to smoke around you or in your living environment  Smoking Cessation Resources:  1 800 QUIT NOW  >>> Patient to call this resource and utilize it to help support her quit smoking >>> Keep up your hard work with stopping smoking  You can also contact the [facility] >>>For smoking cessation classes call [phone]  We do not recommend using e-cigarettes as a form of stopping smoking  You can sign up for smoking cessation support texts and information:  >>>https://smokefree.gov/smokefreetxt  Schedule an appointment with our clinical pharmacy team for follow-up regarding smoking cessation  3. COPD mixed type (HCC)  Trelegy Ellipta  >>> 1 puff daily in the morning >>>rinse mouth out after use  >>> This inhaler contains 3 medications that help manage her respiratory status, contact our office if you cannot afford this medication or unable to remain on this medication  Note your daily symptoms > remember "red flags" for COPD:   >>>Increase in cough >>>increase in sputum production >>>increase in shortness of breath or activity  intolerance.   If you notice these symptoms, please call the office to be seen.     We recommend today:  Orders Placed This Encounter  Procedures  . CT Super D Chest Wo Contrast    Standing Status:   Future    Standing Expiration Date:   08/06/2021    Scheduling Instructions:      Complete around 08/06/20    Order Specific Question:   ** REASON FOR EXAM (FREE TEXT)    Answer:   follow up pet+ nodule    Order Specific Question:   Preferred imaging location?    Answer:   [place] [facility]    Order Specific Question:   Radiology Contrast Protocol - do NOT remove file path    Answer:   [email or web address]   Orders Placed This Encounter  Procedures  . CT Super D Chest Wo Contrast   No orders of the defined types were placed in this encounter.   Follow Up:    Return in about 3 months (around 08/06/2020), or if symptoms worsen or fail to improve, for Follow up with Dr. Katrinka Blazing, After Chest CT.  Please present to our office in 2-4 weeks for an appointment with the clinical pharmacy team for:  . Smoking cessation . Medication Access  . Inhaler teaching    Please do your part to reduce the spread of COVID-19:      Reduce your risk of any infection  and COVID19 by using the similar precautions used for avoiding the common cold or flu:  Marland Kitchen Wash your hands often with soap and warm water for at least 20 seconds.  If soap and water are not readily available, use an alcohol-based hand sanitizer with at least 60% alcohol.  . If coughing or sneezing,  cover your mouth and nose by coughing or sneezing into the elbow areas of your shirt or coat, into a tissue or into your sleeve (not your hands). Langley Gauss A MASK when in public  . Avoid shaking hands with others and consider head nods or verbal greetings only. . Avoid touching your eyes, nose, or mouth with unwashed hands.  . Avoid close contact with people who are sick. . Avoid places or events with large numbers of people in one location, like concerts or sporting events. . If you have some symptoms but not all symptoms, continue to monitor at home and seek medical attention if your symptoms worsen. . If you are having a medical emergency, call 911.   [place] / e-Visit: [email or web address]         [facility]: [facility]: [phone]                   [facility]: [phone]     It is flu season:   >>> Best ways to protect herself from the flu: Receive the yearly flu vaccine, practice good hand hygiene washing with soap and also using hand sanitizer when available, eat a nutritious meals, get adequate rest, hydrate appropriately   Please contact the office if your symptoms worsen or you have concerns that you are not improving.   Thank you for choosing  [facility] for your healthcare, and for allowing [place] to partner with you on your healthcare journey. I am thankful to be able to provide care to you today.   Wyn Quaker FNP-C

## 2020-05-06 NOTE — Assessment & Plan Note (Signed)
PET scan showing PET+ 11 mm pulmonary nodule Difficult to reach location Case discussed with Dr. Katrinka Blazing as well as Dr. Delton Coombes. Reviewed options such as following with CT imaging as nodule has been stable over past couple of years or proceeding forward with invasive bronchoscopy for tissue sampling.  Discussion: Patient reporting today that he is asymptomatic.  He is not in favor of proceeding forward with a bronchoscopy at this time.  He agrees for a 87-month CT follow-up with a super D CT chest.  He will have follow-up with Dr. Katrinka Blazing.  If at that time worsening nodular growth we will need to obtain tissue sampling.  Patient agrees.  Patient is aware to notify us if he has acute worsening symptoms such as increased fatigue, weight loss, hemoptysis  Plan: Repeat CT Super D without contrast in August/2021 with follow-up

## 2020-05-12 ENCOUNTER — Other Ambulatory Visit: Payer: Self-pay

## 2020-05-12 ENCOUNTER — Ambulatory Visit (INDEPENDENT_AMBULATORY_CARE_PROVIDER_SITE_OTHER): Payer: Medicare HMO | Admitting: Pharmacist

## 2020-05-12 DIAGNOSIS — F17200 Nicotine dependence, unspecified, uncomplicated: Secondary | ICD-10-CM

## 2020-05-12 DIAGNOSIS — F17210 Nicotine dependence, cigarettes, uncomplicated: Secondary | ICD-10-CM

## 2020-05-12 MED ORDER — NICOTINE POLACRILEX 2 MG MT GUM: CHEWING_GUM | OROMUCOSAL | 0 refills | Status: DC

## 2020-05-12 NOTE — Progress Notes (Signed)
Subjective Patient presents to [facility] and seen by the pharmacist for smoking cessation counseling.   He started Wellbutrin SR 150 mg twice daily at his last visit but has not started taking the nicotine patch as they are not covered through insurance.  Social History   Tobacco Use  Smoking Status Current Every Day Smoker  . Packs/day: 1.00  . Years: 35.00  . Pack years: 35.00  . Types: Cigarettes  Smokeless Tobacco Never Used  Tobacco Comment   0.5 ppd - 04/15/20     Tobacco Use History  Age when started using tobacco on a daily basis 60 age.  Type: cigarettes.  Number of cigarettes per day 8-9, brand [place].  Smokes first cigarette 10 minutes after waking.  Does not wake at night to smoke  Triggers include morning coffee and just habit.  Quit Attempt History   Most recent quit attempt 3 months ago and quit for 3 days cold [place].  Longest time ever been tobacco free 3 days.  Methods tried in the past include cold [place], nicotine gum (not effective),Wellbutrin (started 2 weeks ago)..   Rates IMPORTANCE of quitting tobacco on 1-10 scale of 7.  Rates READINESS of quitting tobacco on 1-10 scale of 7.  Rates CONFIDENCE of quitting tobacco on 1-10 scale of 6.  Motivators to quitting include improve health; barriers include stress     There is no immunization history on file for this patient.   Assessment and Plan  1. Smoking Cessation   Started Wellbutrin 150 mg twice daily 2 weeks ago at last visit.  States he has noticed a decreased desire for smoking past 2 weeks.  He has been able to cut back from 10 cigarettes daily 8 cigarettes daily.  Patient states he is ready to quit smoking.   Reviewed STAR Quit Plan.   S-set quit date: 05/25/2020 T-tell everyone: Kids, grandchildren, and Bible study partner A-anticipate challenges: Morning coffee, after meals, when stressed.  Patient picked 3 alternative activities to try instead of smoking a  cigarette 1) read a book 2) watching crime show 3) go to the movies R- remove all tobacco products: patient will trash cigarettes, lighters, and ash trays by 05/24/2020  Discussed nicotine replacement therapies.  Patient is agreeable to have the patch and nicotine gum if affordable.   Nicotine Patch  Patient counseled on purpose, proper use, and potential adverse effects, including mild itching or redness at the point of application, headache, trouble sleeping, and/or vivid dreams    Patch Schedule for <10 cigarettes daily Weeks 1 to 6: one nicotine patch (14 mg) daily. I will call and re-assess how you are doing at the end of 6 weeks to see how you are doing on 14 mg patch and if you are ready to decrease dose of patch. Weeks 7-8: one nicotine patch (7 mg) daily  Nicotine Gum Patient counseled on purpose, proper use, and potential adverse effects including jaw soreness and upset stomach if swallowing saliva.  Instructed patient to use 4 mg if they smoke a pack a day or more and 2 mg if they smoke less than a pack a day.  Gum dosing schedule Weeks 1 to 6: Chew 1 piece of gum every 1 to 2 hours (maximum: 24 pieces/day); to increase chances of quitting, chew at least 9 pieces/day during the first 6 weeks Weeks 7 to 9: Chew 1 piece of gum every 2 to 4 hours (maximum: 24 pieces/day) Weeks 10 to 12: Chew 1 piece  of gum every 4 to 8 hours (maximum: 24 pieces/day)  Provided information on 1 800-QUIT NOW support program and encourage patient to call resources to help pay for nicotine replacement therapy.  Continue insurance there is a particular NDC that is covered for the patient more affordable alternative.  Will call patient in 6 weeks to follow up on progress.  2. Medication Reconciliation A drug regimen assessment was performed, including review of allergies, interactions, disease-state management, dosing and immunization history. Medications were reviewed with the patient, including name,  instructions, indication, goals of therapy, potential side effects, importance of adherence, and safe use.  3. Immunizations Patient is due for Pneumovax 23 at next appointment.  All questions encouraged and answered. Instructed patient to call with any questions or concerns.  This appointment required 35 minutes of patient care (this includes precharting, chart review, review of results, face-to-face care, etc.).   Mariella Saa, PharmD, [place], [place] Clinical Specialty Pharmacist [phone]  05/12/2020 3:03 PM

## 2020-05-12 NOTE — Patient Instructions (Addendum)
Thank you for meeting with the pharmacy team!  Below is a recap of what we discussed during you visit:  Quit-date set for 05/25/20  CONTINUE Wellbutrin SR 150 mg twice daily  START nicotine patch 14 mg daily for 6 weeks  START nicotine gum as needed (1 piece every 1 to 2 hours with a maximum of 24 pieces a day)  CALL [place] quitline for nicotine replacement therapy resources  Will call in 6 weeks to follow up on progress   Remember list of alternative activities when you crave a cigarette  Read a book  Watch a crime show  Go see a movie  Go fishing  Go to a ball game

## 2020-06-08 ENCOUNTER — Ambulatory Visit: Payer: Medicare HMO | Admitting: Internal Medicine

## 2020-06-08 NOTE — Progress Notes (Deleted)
   S: Seen in f/u for a couple issues: (1) PET-avid lung nodule- minimal SUV elevation at 3, along fissure and would be difficult to access at present size.  After discussion with patient decided to do super-D CT at 3 months with sampling if persistent or growing.  This is due Aug 2021.  (2) COPD- maintained on trelegy  04/13/2020-pulmonary function test-FVC 2.86 (71% predicted), postbronchodilator ratio 68, postbronchodilator FEV1 2.14 (70% predicted), mid flow reversibility, positive bronchodilator with FEV1, DLCO 21.83 (81% predicted)  (3) Right parotid SUV uptake: suspect benign but given focal uptake an asymmetric, warrants consideration for biopsy  (4)  Smoking- ***  O: There were no vitals taken for this visit.  GEN: 74 year old man in no acute distress HEENT: trachea midline, mucus membranes moist CV: Regular rate and rhythm, extremities are warm PULM: Wheezing bilaterally without accessory muscle use GI: Soft, +BS EXT: No edema or clubbing NEURO: Moves all 4 extremities  A/P -COPD, mild by spirometry: continue trelegy -Smoking- *** -Lung nodule- see discussion in HPI -Right parotid FDG uptake- suspect benign but ENT referral given asymmetry   Follow up after Super D CT in August  06/08/2020 Myrla Halsted MD  MDM This appointment required 34 minutes of patient care of which > 50% was spent face to face

## 2020-06-29 ENCOUNTER — Other Ambulatory Visit: Payer: Self-pay | Admitting: Pulmonary Disease

## 2020-06-29 DIAGNOSIS — Z87891 Personal history of nicotine dependence: Secondary | ICD-10-CM

## 2020-07-14 ENCOUNTER — Ambulatory Visit: Payer: Medicare HMO | Admitting: Primary Care

## 2020-07-15 ENCOUNTER — Ambulatory Visit: Payer: Medicare HMO | Admitting: Internal Medicine

## 2020-07-28 ENCOUNTER — Ambulatory Visit: Payer: Medicare HMO | Admitting: Primary Care

## 2020-07-31 ENCOUNTER — Inpatient Hospital Stay: Admission: RE | Admit: 2020-07-31 | Payer: Medicare HMO | Source: Ambulatory Visit

## 2020-07-31 DIAGNOSIS — Z0000 Encounter for general adult medical examination without abnormal findings: Secondary | ICD-10-CM | POA: Diagnosis not present

## 2020-07-31 DIAGNOSIS — J439 Emphysema, unspecified: Secondary | ICD-10-CM | POA: Diagnosis not present

## 2020-07-31 DIAGNOSIS — F17200 Nicotine dependence, unspecified, uncomplicated: Secondary | ICD-10-CM | POA: Diagnosis not present

## 2020-07-31 DIAGNOSIS — Z1389 Encounter for screening for other disorder: Secondary | ICD-10-CM | POA: Diagnosis not present

## 2020-07-31 DIAGNOSIS — I700 Atherosclerosis of aorta: Secondary | ICD-10-CM | POA: Diagnosis not present

## 2020-07-31 DIAGNOSIS — E1142 Type 2 diabetes mellitus with diabetic polyneuropathy: Secondary | ICD-10-CM | POA: Diagnosis not present

## 2020-07-31 DIAGNOSIS — E1151 Type 2 diabetes mellitus with diabetic peripheral angiopathy without gangrene: Secondary | ICD-10-CM | POA: Diagnosis not present

## 2020-07-31 DIAGNOSIS — I10 Essential (primary) hypertension: Secondary | ICD-10-CM | POA: Diagnosis not present

## 2020-07-31 DIAGNOSIS — R911 Solitary pulmonary nodule: Secondary | ICD-10-CM | POA: Diagnosis not present

## 2020-07-31 DIAGNOSIS — I739 Peripheral vascular disease, unspecified: Secondary | ICD-10-CM | POA: Diagnosis not present

## 2020-08-05 ENCOUNTER — Other Ambulatory Visit: Payer: Self-pay

## 2020-08-05 ENCOUNTER — Ambulatory Visit (INDEPENDENT_AMBULATORY_CARE_PROVIDER_SITE_OTHER)
Admission: RE | Admit: 2020-08-05 | Discharge: 2020-08-05 | Disposition: A | Discharge status: Home / Self Care | Payer: Medicare HMO | Source: Ambulatory Visit | Attending: Pulmonary Disease | Admitting: Pulmonary Disease

## 2020-08-05 DIAGNOSIS — I7789 Other specified disorders of arteries and arterioles: Secondary | ICD-10-CM | POA: Diagnosis not present

## 2020-08-05 DIAGNOSIS — R918 Other nonspecific abnormal finding of lung field: Secondary | ICD-10-CM | POA: Diagnosis not present

## 2020-08-05 DIAGNOSIS — I2510 Atherosclerotic heart disease of native coronary artery without angina pectoris: Secondary | ICD-10-CM | POA: Diagnosis not present

## 2020-08-05 DIAGNOSIS — R911 Solitary pulmonary nodule: Secondary | ICD-10-CM | POA: Diagnosis not present

## 2020-08-05 DIAGNOSIS — I700 Atherosclerosis of aorta: Secondary | ICD-10-CM | POA: Diagnosis not present

## 2020-08-07 ENCOUNTER — Ambulatory Visit: Payer: Medicare HMO | Admitting: Primary Care

## 2020-08-11 NOTE — Progress Notes (Deleted)
@Patient  ID: , male    DOB: Nov 08, 1946, 74 y.o.   MRN: [id number]  No chief complaint on file.   Referring provider: [id number], MD  HPI: 74 year old male, current smoker. PMH significant for COPD mixed type, abnormal diagnostic imaging. Former patient of Dr. 61, last seen by pulmonary NP on 05/06/20.   08/12/2020 Patient presents today for follow-up. He has Super-D CT in August that showed spiculated nodules along left major fissure that is unchanged. This nodules is hypermetabolic on prior PET imaging. At his office visit in May discussed that plan would be to conservatively manage, nodule is in a difficult to reach area. Plan for patient to establish with Dr. June for long-term follow-up/management.     No Known Allergies   There is no immunization history on file for this patient.  Past Medical History:  Diagnosis Date  . Hypertension     Tobacco History: Social History   Tobacco Use  Smoking Status Current Every Day Smoker  . Packs/day: 1.00  . Years: 35.00  . Pack years: 35.00  . Types: Cigarettes  Smokeless Tobacco Never Used  Tobacco Comment   0.5 ppd - 04/15/20   Ready to quit: Not Answered Counseling given: Not Answered Comment: 0.5 ppd - 04/15/20   Outpatient Medications Prior to Visit  Medication Sig Dispense Refill  . acetaminophen (TYLENOL) 500 MG tablet Take 500 mg by mouth every 6 (six) hours as needed.    04/17/20 aspirin EC 81 MG tablet Take 81 mg by mouth daily.    Marland Kitchen buPROPion (WELLBUTRIN XL) 150 MG 24 hr tablet TAKE 1 TABLET(150 MG) BY MOUTH DAILY FOR 3 DAYS. INCREASE TO 1 TABLET 2 TIMES DAILY 60 tablet 2  . famotidine (PEPCID) 10 MG tablet Take 10 mg by mouth 2 (two) times daily as needed for heartburn or indigestion.    . Fluticasone-Umeclidin-Vilant (TRELEGY ELLIPTA) 100-62.5-25 MCG/INH AEPB Inhale 1 puff into the lungs daily. 14 each 0  . nicotine (NICODERM CQ - DOSED IN MG/24 HOURS) 14 mg/24hr patch Place 1 patch (14 mg total)  onto the skin daily. (Patient not taking: Reported on 04/15/2020) 28 patch 1  . nicotine polacrilex (NICORETTE) 2 MG gum Chew 1 piece of gum every 1-2 hours as needed for smoking cessation.  Maximum of 24 pieces daily. 100 each 0  . pravastatin (PRAVACHOL) 40 MG tablet Take 40 mg by mouth daily.    . valsartan-hydrochlorothiazide (DIOVAN-HCT) 160-25 MG tablet Take 1 tablet by mouth daily.     No facility-administered medications prior to visit.      Review of Systems  Review of Systems   Physical Exam  There were no vitals taken for this visit. Physical Exam   Lab Results:  CBC    Component Value Date/Time   HGB 16.7 06/03/2016 1528   HCT 49.0 06/03/2016 1528    BMET    Component Value Date/Time   NA 141 06/03/2016 1528   K 5.1 06/03/2016 1528   CL 106 06/03/2016 1528   GLUCOSE 98 06/03/2016 1528   BUN 9 06/03/2016 1528   CREATININE 0.90 06/03/2016 1528    BNP No results found for: BNP  ProBNP No results found for: PROBNP  Imaging: CT Super D Chest Wo Contrast  Result Date: 08/05/2020 CLINICAL DATA:  Pulmonary nodule. EXAM: CT CHEST WITHOUT CONTRAST TECHNIQUE: Multidetector CT imaging of the chest was performed using thin slice collimation for electromagnetic bronchoscopy planning purposes, without intravenous contrast. COMPARISON:  PET  05/05/2020 and multiple prior chest CT exams dating back to 01/27/2017. CT abdomen pelvis 04/06/2018. FINDINGS: Cardiovascular: Atherosclerotic calcification of the aorta, aortic valve and coronary arteries. Right and left pulmonary arteries are enlarged. Heart is at the upper limits of normal in size. No pericardial effusion. Mediastinum/Nodes: 1.2 cm low-attenuation left thyroid nodule. No follow-up recommended (ref: J Am Coll Radiol. 2015 Feb;12(2): 143-50).Mediastinal lymph nodes are not enlarged by CT size criteria. Hilar regions are difficult to definitively evaluate without IV contrast. No axillary adenopathy. Esophagus is  grossly unremarkable. Lungs/Pleura: Image quality is degraded by respiratory motion. Peribronchial thickening. Multiple bilateral areas of peribronchovascular nodularity in the lungs, as before. Spiculated 9 x 9 mm nodule along the left major fissure (3/82) is also stable and was shown to be hypermetabolic on 05/05/2020. No pleural fluid. Airway is unremarkable. Upper Abdomen: Liver margin is slightly irregular. Low-attenuation lesions in the liver measure up to 1.6 cm in the left hepatic lobe, similar to 01/27/2017, favoring cysts but too small to definitively characterize. Visualized portions of the liver and gallbladder are otherwise unremarkable. 2.7 x 3.1 cm hyperdense lesion off the right adrenal gland is unchanged from baseline examination of 01/27/2017 and was characterized as a lipid poor adenoma on 04/06/2018. Visualized portions of the left adrenal gland, kidneys, spleen, pancreas, stomach and bowel are otherwise unremarkable. Upper abdominal lymph nodes measure up to 9 mm in the periportal region, not enlarged by CT size criteria. Musculoskeletal: Degenerative changes in the spine. No worrisome lytic or sclerotic lesions. IMPRESSION: 1. Spiculated nodule along the left major fissure is unchanged and was shown to be hypermetabolic on 04/09/2020, findings most indicative of primary bronchogenic carcinoma. 2. Additional areas of peribronchovascular nodularity bilaterally, stable. Continued attention on follow-up exams is warranted. 3. Liver margin is slightly irregular, indicative of cirrhosis. 4. Right adrenal mass, characterized as a lipid poor adenoma on 04/06/2018. 5. Aortic atherosclerosis (ICD10-I70.0). Coronary artery calcification. 6. Enlarged pulmonary arteries, indicative of pulmonary arterial hypertension. Electronically Signed   By: Leanna Battles M.D.   On: 08/05/2020 16:31     Assessment & Plan:   No problem-specific Assessment & Plan notes found for this encounter.     Glenford Bayley, NP 08/11/2020

## 2020-08-12 ENCOUNTER — Ambulatory Visit: Payer: Medicare HMO | Admitting: Primary Care

## 2020-08-12 NOTE — Progress Notes (Signed)
ATC, left VM. 

## 2020-08-13 ENCOUNTER — Telehealth: Payer: Self-pay | Admitting: Pulmonary Disease

## 2020-08-13 NOTE — Telephone Encounter (Signed)
Patient contacted  Reviewed plan of care with patient.  He will keep upcoming appointment with EW NP.  Patient was also scheduled on 09/21/2020 a 30-minute time slot with Dr. Delton Coombes to establish care.  Elisha Headland, FNP

## 2020-08-13 NOTE — Telephone Encounter (Signed)
Thank you :)

## 2020-08-13 NOTE — Telephone Encounter (Signed)
08/13/2020  Patient missed appointment yesterday with a EW NP.  Patient has been rescheduled for 08/18/2020.  Patient also needs 30-minute visit to establish care with Dr. Delton Coombes as Dr. Katrinka Blazing is no longer managing patients in the outpatient setting.  Please contact the patient and see if we can get patient scheduled for follow-up with Dr. Delton Coombes sometime over the next 3 months that he can establish care in a 30-minute time slot.  We will route to EW NP as FYI as she can help coordinate this as well if the patient presents to the office on 08/18/2020.  Elisha Headland FNP

## 2020-08-18 ENCOUNTER — Ambulatory Visit: Payer: Medicare HMO | Admitting: Primary Care

## 2020-08-18 ENCOUNTER — Encounter: Payer: Self-pay | Admitting: Primary Care

## 2020-08-18 ENCOUNTER — Other Ambulatory Visit: Payer: Self-pay

## 2020-08-18 DIAGNOSIS — R918 Other nonspecific abnormal finding of lung field: Secondary | ICD-10-CM | POA: Diagnosis not present

## 2020-08-18 DIAGNOSIS — J449 Chronic obstructive pulmonary disease, unspecified: Secondary | ICD-10-CM | POA: Diagnosis not present

## 2020-08-18 DIAGNOSIS — F17200 Nicotine dependence, unspecified, uncomplicated: Secondary | ICD-10-CM | POA: Diagnosis not present

## 2020-08-18 NOTE — Assessment & Plan Note (Signed)
-   Super-D CT in August that showed spiculated nodules along left major fissure that is unchanged. This nodules is hypermetabolic on prior PET imaging.  - Patient has an apt with Dr. Delton Coombes in September to establish care. Likely will monitor with serial imaging, likely will need bx at some point in the future

## 2020-08-18 NOTE — Patient Instructions (Addendum)
Recommend: - Trelegy 1 puff daily in the morning; use albuterol inhaler every 4-6 hours AS NEEDED ONLY for shortness of breath or wheezing  - SARNA lotion- can help with itch (over the counter) - My goal is for you to quit smoking by next visit. Pick a quit date and write it down on the calender.   Follow-up: - Sept 27th at 10:30am with Dr. Delton Coombes to establish care

## 2020-08-18 NOTE — Progress Notes (Signed)
@Patient  ID: , male    DOB: [birth year], 74 y.o.   MRN: [id number]  Chief Complaint  Patient presents with  . Follow-up    Referring provider: [id number], MD  HPI: 74 year old male, current smoker. PMH significant for COPD mixed type, abnormal diagnostic imaging. Former patient of Dr. 61, last seen by pulmonary NP on 05/06/20.   08/18/2020 Patient presents today for follow-up. He is doing well. He has not acute complaints. He is down to 1-2 cigarettes a day. He is taking Trelegy Ellipta one puffs daily in the morning, he had stopped this for a short period when his PCP started him on Albuterol. He is not taking regularly as prescribed and reports noticeable improvement.  He has Super-D CT in August that showed spiculated nodules along left major fissure that is unchanged. This nodules is hypermetabolic on prior PET imaging. At his office visit in May discussed that plan would be to conservatively manage, nodule is in a difficult to reach area. Plan for patient to establish with Dr. June for long-term follow-up/management.   No Known Allergies  Immunization History  Administered Date(s) Administered  . Fluad Quad(high Dose 65+) 10/19/2019  . PFIZER SARS-COV-2 Vaccination 08/03/2020    Past Medical History:  Diagnosis Date  . Hypertension     Tobacco History: Social History   Tobacco Use  Smoking Status Current Every Day Smoker  . Packs/day: 1.00  . Years: 35.00  . Pack years: 35.00  . Types: Cigarettes  Smokeless Tobacco Never Used  Tobacco Comment   1-2 cig/day as of 08/18/2020   Ready to quit: Not Answered Counseling given: Not Answered Comment: 1-2 cig/day as of 08/18/2020   Outpatient Medications Prior to Visit  Medication Sig Dispense Refill  . acetaminophen (TYLENOL) 500 MG tablet Take 500 mg by mouth every 6 (six) hours as needed.    08/20/2020 albuterol (VENTOLIN HFA) 108 (90 Base) MCG/ACT inhaler     . aspirin EC 81 MG tablet Take 81 mg by mouth  daily.    Marland Kitchen buPROPion (WELLBUTRIN XL) 150 MG 24 hr tablet TAKE 1 TABLET(150 MG) BY MOUTH DAILY FOR 3 DAYS. INCREASE TO 1 TABLET 2 TIMES DAILY 60 tablet 2  . famotidine (PEPCID) 10 MG tablet Take 10 mg by mouth 2 (two) times daily as needed for heartburn or indigestion.    . nicotine (NICODERM CQ - DOSED IN MG/24 HOURS) 14 mg/24hr patch Place 1 patch (14 mg total) onto the skin daily. 28 patch 1  . nicotine polacrilex (NICORETTE) 2 MG gum Chew 1 piece of gum every 1-2 hours as needed for smoking cessation.  Maximum of 24 pieces daily. 100 each 0  . pravastatin (PRAVACHOL) 40 MG tablet Take 40 mg by mouth daily.    . valsartan-hydrochlorothiazide (DIOVAN-HCT) 160-25 MG tablet Take 1 tablet by mouth daily.    . Fluticasone-Umeclidin-Vilant (TRELEGY ELLIPTA) 100-62.5-25 MCG/INH AEPB Inhale 1 puff into the lungs daily. (Patient not taking: Reported on 08/18/2020) 14 each 0   No facility-administered medications prior to visit.   Review of Systems  Review of Systems  Constitutional: Negative.   Respiratory: Negative for cough, chest tightness and shortness of breath.   Cardiovascular: Negative.    Physical Exam  BP 126/70 (BP Location: Right Arm, Cuff Size: Normal)   Pulse (!) 50   Temp 97.6 F (36.4 C)   Ht 5' 11.5" (1.816 m)   Wt 244 lb 6.4 oz (110.9 kg)   SpO2 100%  BMI 33.61 kg/m  Physical Exam Constitutional:      Appearance: Normal appearance.  HENT:     Head: Normocephalic and atraumatic.     Mouth/Throat:     Pharynx: Oropharynx is clear.  Eyes:     Extraocular Movements: Extraocular movements intact.     Conjunctiva/sclera: Conjunctivae normal.     Pupils: Pupils are equal, round, and reactive to light.  Cardiovascular:     Rate and Rhythm: Normal rate and regular rhythm.  Pulmonary:     Effort: Pulmonary effort is normal. No respiratory distress.     Breath sounds: Wheezing present. No rhonchi or rales.     Comments: Dull exp wheeze Musculoskeletal:        General:  Normal range of motion.  Neurological:     General: No focal deficit present.     Mental Status: He is alert and oriented to person, place, and time. Mental status is at baseline.  Psychiatric:        Mood and Affect: Mood normal.        Behavior: Behavior normal.        Thought Content: Thought content normal.        Judgment: Judgment normal.      Lab Results:  CBC    Component Value Date/Time   HGB 16.7 06/03/2016 1528   HCT 49.0 06/03/2016 1528    BMET    Component Value Date/Time   NA 141 06/03/2016 1528   K 5.1 06/03/2016 1528   CL 106 06/03/2016 1528   GLUCOSE 98 06/03/2016 1528   BUN 9 06/03/2016 1528   CREATININE 0.90 06/03/2016 1528    BNP No results found for: BNP  ProBNP No results found for: PROBNP  Imaging: CT Super D Chest Wo Contrast  Result Date: 08/05/2020 CLINICAL DATA:  Pulmonary nodule. EXAM: CT CHEST WITHOUT CONTRAST TECHNIQUE: Multidetector CT imaging of the chest was performed using thin slice collimation for electromagnetic bronchoscopy planning purposes, without intravenous contrast. COMPARISON:  PET 05/05/2020 and multiple prior chest CT exams dating back to 01/27/2017. CT abdomen pelvis 04/06/2018. FINDINGS: Cardiovascular: Atherosclerotic calcification of the aorta, aortic valve and coronary arteries. Right and left pulmonary arteries are enlarged. Heart is at the upper limits of normal in size. No pericardial effusion. Mediastinum/Nodes: 1.2 cm low-attenuation left thyroid nodule. No follow-up recommended (ref: J Am Coll Radiol. 2015 Feb;12(2): 143-50).Mediastinal lymph nodes are not enlarged by CT size criteria. Hilar regions are difficult to definitively evaluate without IV contrast. No axillary adenopathy. Esophagus is grossly unremarkable. Lungs/Pleura: Image quality is degraded by respiratory motion. Peribronchial thickening. Multiple bilateral areas of peribronchovascular nodularity in the lungs, as before. Spiculated 9 x 9 mm nodule along  the left major fissure (3/82) is also stable and was shown to be hypermetabolic on 05/05/2020. No pleural fluid. Airway is unremarkable. Upper Abdomen: Liver margin is slightly irregular. Low-attenuation lesions in the liver measure up to 1.6 cm in the left hepatic lobe, similar to 01/27/2017, favoring cysts but too small to definitively characterize. Visualized portions of the liver and gallbladder are otherwise unremarkable. 2.7 x 3.1 cm hyperdense lesion off the right adrenal gland is unchanged from baseline examination of 01/27/2017 and was characterized as a lipid poor adenoma on 04/06/2018. Visualized portions of the left adrenal gland, kidneys, spleen, pancreas, stomach and bowel are otherwise unremarkable. Upper abdominal lymph nodes measure up to 9 mm in the periportal region, not enlarged by CT size criteria. Musculoskeletal: Degenerative changes in the spine. No worrisome  lytic or sclerotic lesions. IMPRESSION: 1. Spiculated nodule along the left major fissure is unchanged and was shown to be hypermetabolic on 04/09/2020, findings most indicative of primary bronchogenic carcinoma. 2. Additional areas of peribronchovascular nodularity bilaterally, stable. Continued attention on follow-up exams is warranted. 3. Liver margin is slightly irregular, indicative of cirrhosis. 4. Right adrenal mass, characterized as a lipid poor adenoma on 04/06/2018. 5. Aortic atherosclerosis (ICD10-I70.0). Coronary artery calcification. 6. Enlarged pulmonary arteries, indicative of pulmonary arterial hypertension. Electronically Signed   By: Leanna Battles M.D.   On: 08/05/2020 16:31     Assessment & Plan:   COPD mixed type (HCC) - Stable; Continue Trelegy 1 puff daily in the morning, albuterol inhaler every 4-6 hours as needed for shortness of breath or wheezing   Current smoker - Current smoker, 1-2 cigarettes a day. He is motivated to quit smoking - Encouraged patient to pick quit date, states that he would like  to quit by September 12th, 2021 - Patient can use over the counter Sarna lotion for itch  Abnormal findings on diagnostic imaging of lung - Super-D CT in August that showed spiculated nodules along left major fissure that is unchanged. This nodules is hypermetabolic on prior PET imaging.  - Patient has an apt with Dr. Delton Coombes in September to establish care. Likely will monitor with serial imaging, likely will need bx at some point in the future      Glenford Bayley, NP 08/18/2020

## 2020-08-18 NOTE — Assessment & Plan Note (Signed)
-   Stable; Continue Trelegy 1 puff daily in the morning, albuterol inhaler every 4-6 hours as needed for shortness of breath or wheezing

## 2020-08-18 NOTE — Assessment & Plan Note (Signed)
-   Current smoker, 1-2 cigarettes a day. He is motivated to quit smoking - Encouraged patient to pick quit date, states that he would like to quit by September 12th, 2021 - Patient can use over the counter Sarna lotion for itch

## 2020-08-21 NOTE — Progress Notes (Signed)
To review next office visits.

## 2020-09-21 ENCOUNTER — Ambulatory Visit: Payer: Medicare HMO | Admitting: Emergency Medicine

## 2020-10-07 ENCOUNTER — Ambulatory Visit: Payer: Medicare HMO | Admitting: Emergency Medicine

## 2021-01-29 DIAGNOSIS — F17200 Nicotine dependence, unspecified, uncomplicated: Secondary | ICD-10-CM | POA: Diagnosis not present

## 2021-01-29 DIAGNOSIS — I10 Essential (primary) hypertension: Secondary | ICD-10-CM | POA: Diagnosis not present

## 2021-01-29 DIAGNOSIS — J439 Emphysema, unspecified: Secondary | ICD-10-CM | POA: Diagnosis not present

## 2021-01-29 DIAGNOSIS — E7800 Pure hypercholesterolemia, unspecified: Secondary | ICD-10-CM | POA: Diagnosis not present

## 2021-01-29 DIAGNOSIS — Z23 Encounter for immunization: Secondary | ICD-10-CM | POA: Diagnosis not present
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
## 2022-03-01 DIAGNOSIS — E1151 Type 2 diabetes mellitus with diabetic peripheral angiopathy without gangrene: Secondary | ICD-10-CM | POA: Diagnosis not present

## 2022-03-01 DIAGNOSIS — J439 Emphysema, unspecified: Secondary | ICD-10-CM | POA: Diagnosis not present

## 2022-03-01 DIAGNOSIS — R9389 Abnormal findings on diagnostic imaging of other specified body structures: Secondary | ICD-10-CM | POA: Diagnosis not present

## 2022-03-01 DIAGNOSIS — Z0000 Encounter for general adult medical examination without abnormal findings: Secondary | ICD-10-CM | POA: Diagnosis not present

## 2022-03-01 DIAGNOSIS — I10 Essential (primary) hypertension: Secondary | ICD-10-CM | POA: Diagnosis not present

## 2022-03-01 DIAGNOSIS — Z1389 Encounter for screening for other disorder: Secondary | ICD-10-CM | POA: Diagnosis not present

## 2022-03-01 DIAGNOSIS — F17200 Nicotine dependence, unspecified, uncomplicated: Secondary | ICD-10-CM | POA: Diagnosis not present

## 2022-03-01 DIAGNOSIS — E7800 Pure hypercholesterolemia, unspecified: Secondary | ICD-10-CM | POA: Diagnosis not present

## 2022-03-01 DIAGNOSIS — I700 Atherosclerosis of aorta: Secondary | ICD-10-CM | POA: Diagnosis not present

## 2022-09-06 ENCOUNTER — Other Ambulatory Visit: Payer: Self-pay | Admitting: Internal Medicine

## 2022-09-06 DIAGNOSIS — R911 Solitary pulmonary nodule: Secondary | ICD-10-CM | POA: Diagnosis not present

## 2022-09-06 DIAGNOSIS — J439 Emphysema, unspecified: Secondary | ICD-10-CM | POA: Diagnosis not present

## 2022-09-06 DIAGNOSIS — E1142 Type 2 diabetes mellitus with diabetic polyneuropathy: Secondary | ICD-10-CM | POA: Diagnosis not present

## 2022-09-06 DIAGNOSIS — Z23 Encounter for immunization: Secondary | ICD-10-CM | POA: Diagnosis not present

## 2022-09-06 DIAGNOSIS — E1151 Type 2 diabetes mellitus with diabetic peripheral angiopathy without gangrene: Secondary | ICD-10-CM | POA: Diagnosis not present

## 2022-09-06 DIAGNOSIS — F17200 Nicotine dependence, unspecified, uncomplicated: Secondary | ICD-10-CM | POA: Diagnosis not present

## 2022-09-06 DIAGNOSIS — I739 Peripheral vascular disease, unspecified: Secondary | ICD-10-CM | POA: Diagnosis not present

## 2022-09-06 DIAGNOSIS — I10 Essential (primary) hypertension: Secondary | ICD-10-CM | POA: Diagnosis not present

## 2022-11-29 ENCOUNTER — Other Ambulatory Visit: Payer: Medicare HMO

## 2022-12-15 DIAGNOSIS — H5213 Myopia, bilateral: Secondary | ICD-10-CM | POA: Diagnosis not present

## 2022-12-15 DIAGNOSIS — H35033 Hypertensive retinopathy, bilateral: Secondary | ICD-10-CM | POA: Diagnosis not present

## 2022-12-15 DIAGNOSIS — H52223 Regular astigmatism, bilateral: Secondary | ICD-10-CM | POA: Diagnosis not present

## 2022-12-15 DIAGNOSIS — S0512XS Contusion of eyeball and orbital tissues, left eye, sequela: Secondary | ICD-10-CM | POA: Diagnosis not present

## 2022-12-15 DIAGNOSIS — H2513 Age-related nuclear cataract, bilateral: Secondary | ICD-10-CM | POA: Diagnosis not present

## 2022-12-15 DIAGNOSIS — H524 Presbyopia: Secondary | ICD-10-CM | POA: Diagnosis not present

## 2022-12-15 DIAGNOSIS — H21562 Pupillary abnormality, left eye: Secondary | ICD-10-CM | POA: Diagnosis not present

## 2022-12-28 ENCOUNTER — Ambulatory Visit
Admission: RE | Admit: 2022-12-28 | Discharge: 2022-12-28 | Disposition: A | Discharge status: Home / Self Care | Payer: Medicare Other | Source: Ambulatory Visit | Attending: Internal Medicine | Admitting: Internal Medicine

## 2022-12-28 DIAGNOSIS — F17210 Nicotine dependence, cigarettes, uncomplicated: Secondary | ICD-10-CM | POA: Diagnosis not present

## 2022-12-28 DIAGNOSIS — R911 Solitary pulmonary nodule: Secondary | ICD-10-CM

## 2023-01-05 ENCOUNTER — Telehealth: Payer: Self-pay | Admitting: Emergency Medicine

## 2023-01-05 NOTE — Telephone Encounter (Signed)
Patient stated he is returning a call.  He said he stepped away and missed the call.  Please call patient back at [phone]

## 2023-01-05 NOTE — Telephone Encounter (Signed)
Can we call this patient back and get him scheduled with Dr Lamonte Sakai for New consult for an abnormal CT scan.   Please and thank you

## 2023-01-06 NOTE — Telephone Encounter (Signed)
Patient is scheduled on 01/11/2023 at 3pm with Dr. Marsh Dolly aware of location, nothing further needed.

## 2023-01-11 ENCOUNTER — Ambulatory Visit: Payer: Medicare Other | Admitting: Emergency Medicine

## 2023-02-17 ENCOUNTER — Ambulatory Visit (INDEPENDENT_AMBULATORY_CARE_PROVIDER_SITE_OTHER): Payer: Medicare Other | Admitting: Emergency Medicine

## 2023-02-17 ENCOUNTER — Encounter: Payer: Self-pay | Admitting: Emergency Medicine

## 2023-02-17 VITALS — BP 150/78 | HR 69 | Temp 97.8°F | Ht 72.0 in | Wt 241.0 lb

## 2023-02-17 DIAGNOSIS — R918 Other nonspecific abnormal finding of lung field: Secondary | ICD-10-CM

## 2023-02-17 DIAGNOSIS — F17200 Nicotine dependence, unspecified, uncomplicated: Secondary | ICD-10-CM

## 2023-02-17 DIAGNOSIS — J449 Chronic obstructive pulmonary disease, unspecified: Secondary | ICD-10-CM

## 2023-02-17 NOTE — Assessment & Plan Note (Signed)
Discussed cessation today.  Encouraged him to cut down.  We can talk about setting a quit date next time

## 2023-02-17 NOTE — Addendum Note (Signed)
Addended by: Loma Sousa on: 02/17/2023 02:32 PM   Modules accepted: Orders

## 2023-02-17 NOTE — Progress Notes (Signed)
Subjective:    Patient ID: Jeremy Hall, male    DOB: 10-May-1946, 77 y.o.   MRN: [id number]  HPI 77 year old active smoker (35 pack years) with a history of COPD, pulmonary nodular disease, hypertension.  Last seen in our office 08/18/2020. He is off Trelegy.  He uses albuterol approximately 2x a week. He remains active, occasional SOB.  Continues to smoke approximately   CT chest 08/05/2020 reviewed by me showed a spiculated 9 mm nodule at the left major fissure, areas of peribronchovascular nodularity, irregular liver margin.  Lung cancer screening CT chest 12/28/2022 reviewed by me shows 2.2 cm right upper lobe spiculated pulmonary nodule, 1.9 cm spiculated left upper lobe pulmonary nodule   Review of Systems As per HPI  Past Medical History:  Diagnosis Date   Hypertension      Family History  Problem Relation Age of Onset   Cancer Mother      Social History   Socioeconomic History   Marital status: Divorced    Spouse name: Not on file   Number of children: Not on file   Years of education: Not on file   Highest education level: Not on file  Occupational History   Not on file  Tobacco Use   Smoking status: Every Day    Packs/day: 1.00    Years: 35.00    Total pack years: 35.00    Types: Cigarettes   Smokeless tobacco: Never   Tobacco comments:    1-2 cig/day as of 08/18/2020  Substance and Sexual Activity   Alcohol use: No   Drug use: Not on file   Sexual activity: Not on file  Other Topics Concern   Not on file  Social History Narrative   Not on file   Social Determinants of Health   Financial Resource Strain: Not on file  Food Insecurity: Not on file  Transportation Needs: Not on file  Physical Activity: Not on file  Stress: Not on file  Social Connections: Not on file  Intimate Partner Violence: Not on file     No Known Allergies   Outpatient Medications Prior to Visit  Medication Sig Dispense Refill   acetaminophen (TYLENOL) 500 MG tablet  Take 500 mg by mouth every 6 (six) hours as needed.     albuterol (VENTOLIN HFA) 108 (90 Base) MCG/ACT inhaler      aspirin EC 81 MG tablet Take 81 mg by mouth daily.     buPROPion (WELLBUTRIN XL) 150 MG 24 hr tablet TAKE 1 TABLET(150 MG) BY MOUTH DAILY FOR 3 DAYS. INCREASE TO 1 TABLET 2 TIMES DAILY 60 tablet 2   famotidine (PEPCID) 10 MG tablet Take 10 mg by mouth 2 (two) times daily as needed for heartburn or indigestion.     nicotine polacrilex (NICORETTE) 2 MG gum Chew 1 piece of gum every 1-2 hours as needed for smoking cessation.  Maximum of 24 pieces daily. 100 each 0   pravastatin (PRAVACHOL) 40 MG tablet Take 40 mg by mouth daily.     valsartan-hydrochlorothiazide (DIOVAN-HCT) 160-25 MG tablet Take 1 tablet by mouth daily.     Fluticasone-Umeclidin-Vilant (TRELEGY ELLIPTA) 100-62.5-25 MCG/INH AEPB Inhale 1 puff into the lungs daily. (Patient not taking: Reported on 08/18/2020) 14 each 0   nicotine (NICODERM CQ - DOSED IN MG/24 HOURS) 14 mg/24hr patch Place 1 patch (14 mg total) onto the skin daily. (Patient not taking: Reported on 02/17/2023) 28 patch 1   No facility-administered medications prior to visit.  Objective:   Physical Exam  Vitals:   02/17/23 1350  BP: (!) 150/78  Pulse: 69  Temp: 97.8 F (36.6 C)  TempSrc: Oral  SpO2: 99%  Weight: 241 lb (109.3 kg)  Height: 6' (1.829 m)   Gen: Pleasant, well-nourished, in no distress,  normal affect  ENT: No lesions,  mouth clear,  oropharynx clear, no postnasal drip  Neck: No JVD, no stridor  Lungs: No use of accessory muscles, no crackles or wheezing on normal respiration, no wheeze on forced expiration  Cardiovascular: RRR, heart sounds normal, no murmur or gallops, no peripheral edema  Musculoskeletal: No deformities, no cyanosis or clubbing  Neuro: alert, awake, non focal  Skin: Warm, no lesions or rash       Assessment & Plan:  Pulmonary nodules Bilateral upper lobe pulmonary nodules that have  increased in size going back to prior scan from 07/2020.  Both concerning for simultaneous primary lung cancers.  Discussed this with him today and he agrees to robotic assisted navigational bronchoscopy.  He will need a repeat super D CT in order to facilitate.  He needs a little bit of time to arrange for transportation, etc.  Request that we do this in mid March.  I will try to get this done on 03/06/2023.  COPD mixed type [facility]) Not currently on maintenance bronchodilator therapy.  He stopped his Trelegy.  He does sometimes use his albuterol.  I like to try him on Stiolto to see if he gets benefit.  If so then we can plan to continue going forward.  Current smoker Discussed cessation today.  Encouraged him to cut down.  We can talk about setting a quit date next time   Baltazar Apo, MD, PhD 02/17/2023, 2:31 PM [facility] [phone] or if no answer before 7:00PM call [phone] For any issues after 7:00PM please call eLink [phone]

## 2023-02-17 NOTE — Addendum Note (Signed)
Addended by: Loma Sousa on: 02/17/2023 02:33 PM   Modules accepted: Orders

## 2023-02-17 NOTE — Assessment & Plan Note (Signed)
Bilateral upper lobe pulmonary nodules that have increased in size going back to prior scan from 07/2020.  Both concerning for simultaneous primary lung cancers.  Discussed this with him today and he agrees to robotic assisted navigational bronchoscopy.  He will need a repeat super D CT in order to facilitate.  He needs a little bit of time to arrange for transportation, etc.  Request that we do this in mid March.  I will try to get this done on 03/06/2023.

## 2023-02-17 NOTE — Patient Instructions (Signed)
We will do a trial of Stiolto 2 puffs once daily.  Take this every day as a maintenance medication.  Keep track of whether the medicine helps you.  If so we will plan to continue it going forward. You can continue to use your albuterol 2 puffs up to every 4 hours if needed for shortness of breath, chest tightness, wheezing. We reviewed your CT scan of the chest.  We will plan to perform bronchoscopy under general anesthesia as an outpatient to evaluate your pulmonary nodules.  This will be done at [facility] endoscopy.  We will try to get this arranged for 03/06/2023.  You will need a designated driver. We will repeat your CT scan of the chest prior to your bronchoscopy. Work to decrease your cigarettes if at all possible. Follow Dr. Lamonte Sakai in 1 month or next available.

## 2023-02-17 NOTE — Assessment & Plan Note (Signed)
Not currently on maintenance bronchodilator therapy.  He stopped his Trelegy.  He does sometimes use his albuterol.  I like to try him on Stiolto to see if he gets benefit.  If so then we can plan to continue going forward.

## 2023-02-17 NOTE — H&P (View-Only) (Signed)
 Subjective:    Patient ID: Jeremy Hall, male    DOB: 05/15/1946, 76 y.o.   MRN: [id number]  HPI 76-year-old active smoker (35 pack years) with a history of COPD, pulmonary nodular disease, hypertension.  Last seen in our office 08/18/2020. He is off Trelegy.  He uses albuterol approximately 2x a week. He remains active, occasional SOB.  Continues to smoke approximately   CT chest 08/05/2020 reviewed by me showed a spiculated 9 mm nodule at the left major fissure, areas of peribronchovascular nodularity, irregular liver margin.  Lung cancer screening CT chest 12/28/2022 reviewed by me shows 2.2 cm right upper lobe spiculated pulmonary nodule, 1.9 cm spiculated left upper lobe pulmonary nodule   Review of Systems As per HPI  Past Medical History:  Diagnosis Date   Hypertension      Family History  Problem Relation Age of Onset   Cancer Mother      Social History   Socioeconomic History   Marital status: Divorced    Spouse name: Not on file   Number of children: Not on file   Years of education: Not on file   Highest education level: Not on file  Occupational History   Not on file  Tobacco Use   Smoking status: Every Day    Packs/day: 1.00    Years: 35.00    Total pack years: 35.00    Types: Cigarettes   Smokeless tobacco: Never   Tobacco comments:    1-2 cig/day as of 08/18/2020  Substance and Sexual Activity   Alcohol use: No   Drug use: Not on file   Sexual activity: Not on file  Other Topics Concern   Not on file  Social History Narrative   Not on file   Social Determinants of Health   Financial Resource Strain: Not on file  Food Insecurity: Not on file  Transportation Needs: Not on file  Physical Activity: Not on file  Stress: Not on file  Social Connections: Not on file  Intimate Partner Violence: Not on file     No Known Allergies   Outpatient Medications Prior to Visit  Medication Sig Dispense Refill   acetaminophen (TYLENOL) 500 MG tablet  Take 500 mg by mouth every 6 (six) hours as needed.     albuterol (VENTOLIN HFA) 108 (90 Base) MCG/ACT inhaler      aspirin EC 81 MG tablet Take 81 mg by mouth daily.     buPROPion (WELLBUTRIN XL) 150 MG 24 hr tablet TAKE 1 TABLET(150 MG) BY MOUTH DAILY FOR 3 DAYS. INCREASE TO 1 TABLET 2 TIMES DAILY 60 tablet 2   famotidine (PEPCID) 10 MG tablet Take 10 mg by mouth 2 (two) times daily as needed for heartburn or indigestion.     nicotine polacrilex (NICORETTE) 2 MG gum Chew 1 piece of gum every 1-2 hours as needed for smoking cessation.  Maximum of 24 pieces daily. 100 each 0   pravastatin (PRAVACHOL) 40 MG tablet Take 40 mg by mouth daily.     valsartan-hydrochlorothiazide (DIOVAN-HCT) 160-25 MG tablet Take 1 tablet by mouth daily.     Fluticasone-Umeclidin-Vilant (TRELEGY ELLIPTA) 100-62.5-25 MCG/INH AEPB Inhale 1 puff into the lungs daily. (Patient not taking: Reported on 08/18/2020) 14 each 0   nicotine (NICODERM CQ - DOSED IN MG/24 HOURS) 14 mg/24hr patch Place 1 patch (14 mg total) onto the skin daily. (Patient not taking: Reported on 02/17/2023) 28 patch 1   No facility-administered medications prior to visit.           Objective:   Physical Exam  Vitals:   02/17/23 1350  BP: (!) 150/78  Pulse: 69  Temp: 97.8 F (36.6 C)  TempSrc: Oral  SpO2: 99%  Weight: 241 lb (109.3 kg)  Height: 6' (1.829 m)   Gen: Pleasant, well-nourished, in no distress,  normal affect  ENT: No lesions,  mouth clear,  oropharynx clear, no postnasal drip  Neck: No JVD, no stridor  Lungs: No use of accessory muscles, no crackles or wheezing on normal respiration, no wheeze on forced expiration  Cardiovascular: RRR, heart sounds normal, no murmur or gallops, no peripheral edema  Musculoskeletal: No deformities, no cyanosis or clubbing  Neuro: alert, awake, non focal  Skin: Warm, no lesions or rash       Assessment & Plan:  Pulmonary nodules Bilateral upper lobe pulmonary nodules that have  increased in size going back to prior scan from 07/2020.  Both concerning for simultaneous primary lung cancers.  Discussed this with him today and he agrees to robotic assisted navigational bronchoscopy.  He will need a repeat super D CT in order to facilitate.  He needs a little bit of time to arrange for transportation, etc.  Request that we do this in mid March.  I will try to get this done on 03/06/2023.  COPD mixed type (HCC) Not currently on maintenance bronchodilator therapy.  He stopped his Trelegy.  He does sometimes use his albuterol.  I like to try him on Stiolto to see if he gets benefit.  If so then we can plan to continue going forward.  Current smoker Discussed cessation today.  Encouraged him to cut down.  We can talk about setting a quit date next time   Bren Steers, MD, PhD 02/17/2023, 2:31 PM [facility] [phone] or if no answer before 7:00PM call [phone] For any issues after 7:00PM please call eLink [phone]   

## 2023-03-02 ENCOUNTER — Other Ambulatory Visit: Payer: Medicare Other

## 2023-03-02 DIAGNOSIS — R918 Other nonspecific abnormal finding of lung field: Secondary | ICD-10-CM | POA: Diagnosis not present

## 2023-03-03 ENCOUNTER — Other Ambulatory Visit: Payer: Self-pay

## 2023-03-03 ENCOUNTER — Ambulatory Visit (HOSPITAL_COMMUNITY): Payer: Medicare Other

## 2023-03-03 ENCOUNTER — Encounter (HOSPITAL_COMMUNITY): Payer: Self-pay | Admitting: Emergency Medicine

## 2023-03-03 NOTE — Progress Notes (Signed)
Spoke with pt for pre-op call. Pt has hx of HTN and Diabetes. Pt's most recent A1C was 7.1, he states he does not have a CBG meter. Pt is not on any diabetic medications. States it is diet controlled. Denies cardiac history.  Shower instructions given to pt.

## 2023-03-04 LAB — NOVEL CORONAVIRUS, NAA

## 2023-03-05 NOTE — Anesthesia Preprocedure Evaluation (Signed)
Anesthesia Evaluation  Patient identified by MRN, date of birth, ID band Patient awake    Reviewed: Allergy & Precautions, NPO status , Patient's Chart, lab work & pertinent test results  History of Anesthesia Complications Negative for: history of anesthetic complications  Airway Mallampati: II  TM Distance: >3 FB Neck ROM: Full    Dental  (+) Poor Dentition, Missing, Edentulous Upper,    Pulmonary COPD, Current Smoker and Patient abstained from smoking.  BILATERAL PULMONARY NODULES   Pulmonary exam normal        Cardiovascular hypertension, + Peripheral Vascular Disease  Normal cardiovascular exam     Neuro/Psych negative neurological ROS  negative psych ROS   GI/Hepatic Neg liver ROS,GERD  Controlled,,  Endo/Other  diabetes, Type 2    Renal/GU negative Renal ROS  negative genitourinary   Musculoskeletal  (+) Arthritis ,    Abdominal   Peds  Hematology negative hematology ROS (+)   Anesthesia Other Findings Day of surgery medications reviewed with patient.  Reproductive/Obstetrics negative OB ROS                              Anesthesia Physical Anesthesia Plan  ASA: 3  Anesthesia Plan: General   Post-op Pain Management: Minimal or no pain anticipated   Induction: Intravenous  PONV Risk Score and Plan: 2 and Treatment may vary due to age or medical condition, Ondansetron and Dexamethasone  Airway Management Planned: Oral ETT  Additional Equipment: None  Intra-op Plan:   Post-operative Plan: Extubation in OR  Informed Consent: I have reviewed the patients History and Physical, chart, labs and discussed the procedure including the risks, benefits and alternatives for the proposed anesthesia with the patient or authorized representative who has indicated his/her understanding and acceptance.     Dental advisory given  Plan Discussed with: CRNA  Anesthesia Plan  Comments:         Anesthesia Quick Evaluation

## 2023-03-06 ENCOUNTER — Ambulatory Visit (HOSPITAL_BASED_OUTPATIENT_CLINIC_OR_DEPARTMENT_OTHER): Payer: Medicare Other | Admitting: Anesthesiology

## 2023-03-06 ENCOUNTER — Encounter (HOSPITAL_COMMUNITY)
Admission: RE | Disposition: A | Discharge status: Home / Self Care | Payer: Self-pay | Source: Ambulatory Visit | Attending: Emergency Medicine

## 2023-03-06 ENCOUNTER — Ambulatory Visit (HOSPITAL_COMMUNITY): Payer: Medicare Other

## 2023-03-06 ENCOUNTER — Ambulatory Visit (HOSPITAL_COMMUNITY): Payer: Medicare Other | Admitting: Anesthesiology

## 2023-03-06 ENCOUNTER — Ambulatory Visit (HOSPITAL_COMMUNITY)
Admission: RE | Admit: 2023-03-06 | Discharge: 2023-03-06 | Disposition: A | Discharge status: Home / Self Care | Payer: Medicare Other | Source: Ambulatory Visit | Attending: Emergency Medicine | Admitting: Emergency Medicine

## 2023-03-06 ENCOUNTER — Encounter (HOSPITAL_COMMUNITY): Payer: Self-pay | Admitting: Emergency Medicine

## 2023-03-06 ENCOUNTER — Ambulatory Visit (HOSPITAL_COMMUNITY): Admission: RE | Admit: 2023-03-06 | Payer: Medicare Other | Source: Ambulatory Visit | Admitting: Emergency Medicine

## 2023-03-06 ENCOUNTER — Other Ambulatory Visit: Payer: Self-pay

## 2023-03-06 DIAGNOSIS — K219 Gastro-esophageal reflux disease without esophagitis: Secondary | ICD-10-CM | POA: Insufficient documentation

## 2023-03-06 DIAGNOSIS — J449 Chronic obstructive pulmonary disease, unspecified: Secondary | ICD-10-CM | POA: Insufficient documentation

## 2023-03-06 DIAGNOSIS — R918 Other nonspecific abnormal finding of lung field: Secondary | ICD-10-CM

## 2023-03-06 DIAGNOSIS — F17210 Nicotine dependence, cigarettes, uncomplicated: Secondary | ICD-10-CM

## 2023-03-06 DIAGNOSIS — E119 Type 2 diabetes mellitus without complications: Secondary | ICD-10-CM | POA: Insufficient documentation

## 2023-03-06 DIAGNOSIS — R846 Abnormal cytological findings in specimens from respiratory organs and thorax: Secondary | ICD-10-CM | POA: Diagnosis not present

## 2023-03-06 DIAGNOSIS — Z1152 Encounter for screening for COVID-19: Secondary | ICD-10-CM | POA: Insufficient documentation

## 2023-03-06 DIAGNOSIS — J432 Centrilobular emphysema: Secondary | ICD-10-CM | POA: Diagnosis not present

## 2023-03-06 DIAGNOSIS — C3412 Malignant neoplasm of upper lobe, left bronchus or lung: Secondary | ICD-10-CM | POA: Diagnosis not present

## 2023-03-06 DIAGNOSIS — R911 Solitary pulmonary nodule: Secondary | ICD-10-CM | POA: Diagnosis not present

## 2023-03-06 DIAGNOSIS — I739 Peripheral vascular disease, unspecified: Secondary | ICD-10-CM | POA: Diagnosis not present

## 2023-03-06 DIAGNOSIS — I10 Essential (primary) hypertension: Secondary | ICD-10-CM

## 2023-03-06 DIAGNOSIS — F17200 Nicotine dependence, unspecified, uncomplicated: Secondary | ICD-10-CM | POA: Diagnosis not present

## 2023-03-06 HISTORY — PX: BRONCHIAL BRUSHINGS: SHX5108

## 2023-03-06 HISTORY — DX: Gastro-esophageal reflux disease without esophagitis: K21.9

## 2023-03-06 HISTORY — PX: BRONCHIAL BIOPSY: SHX5109

## 2023-03-06 HISTORY — DX: Hypertensive retinopathy, unspecified eye: H35.039

## 2023-03-06 HISTORY — PX: FIDUCIAL MARKER PLACEMENT: SHX6858

## 2023-03-06 HISTORY — PX: BRONCHIAL WASHINGS: SHX5105

## 2023-03-06 HISTORY — DX: Unspecified osteoarthritis, unspecified site: M19.90

## 2023-03-06 HISTORY — DX: Peripheral vascular disease, unspecified: I73.9

## 2023-03-06 HISTORY — DX: Dyspnea, unspecified: R06.00

## 2023-03-06 HISTORY — DX: Pure hypercholesterolemia, unspecified: E78.00

## 2023-03-06 HISTORY — DX: Chronic obstructive pulmonary disease, unspecified: J44.9

## 2023-03-06 HISTORY — DX: Type 2 diabetes mellitus without complications: E11.9

## 2023-03-06 HISTORY — PX: BRONCHIAL NEEDLE ASPIRATION BIOPSY: SHX5106

## 2023-03-06 HISTORY — DX: Solitary pulmonary nodule: R91.1

## 2023-03-06 LAB — CBC
HCT: 49.5 % (ref 39.0–52.0)
Hemoglobin: 16 g/dL (ref 13.0–17.0)
MCH: 29.4 pg (ref 26.0–34.0)
MCHC: 32.3 g/dL (ref 30.0–36.0)
MCV: 91 fL (ref 80.0–100.0)
Platelets: 384 10*3/uL (ref 150–400)
RBC: 5.44 MIL/uL (ref 4.22–5.81)
RDW: 16.6 % — ABNORMAL HIGH (ref 11.5–15.5)
WBC: 11.3 10*3/uL — ABNORMAL HIGH (ref 4.0–10.5)
nRBC: 0 % (ref 0.0–0.2)

## 2023-03-06 LAB — BASIC METABOLIC PANEL
Anion gap: 12 (ref 5–15)
BUN: 21 mg/dL (ref 8–23)
CO2: 23 mmol/L (ref 22–32)
Calcium: 9.4 mg/dL (ref 8.9–10.3)
Chloride: 102 mmol/L (ref 98–111)
Creatinine, Ser: 1.16 mg/dL (ref 0.61–1.24)
GFR, Estimated: >60 mL/min (ref >60)
Glucose, Bld: 128 mg/dL — ABNORMAL HIGH (ref 70–99)
Potassium: 4.6 mmol/L (ref 3.5–5.1)
Sodium: 137 mmol/L (ref 135–145)

## 2023-03-06 LAB — GLUCOSE, CAPILLARY: Glucose-Capillary: 147 mg/dL — ABNORMAL HIGH (ref 70–99)

## 2023-03-06 LAB — SARS CORONAVIRUS 2 BY RT PCR: SARS Coronavirus 2 by RT PCR: NEGATIVE

## 2023-03-06 SURGERY — BRONCHOSCOPY, WITH BIOPSY USING ELECTROMAGNETIC NAVIGATION
Anesthesia: General | Laterality: Bilateral

## 2023-03-06 MED ORDER — AMISULPRIDE (ANTIEMETIC) 5 MG/2ML IV SOLN
10.0000 mg | Freq: Once | INTRAVENOUS | Status: DC | PRN
  Filled 2023-03-06: qty 4

## 2023-03-06 MED ORDER — FENTANYL CITRATE (PF) 250 MCG/5ML IJ SOLN
INTRAMUSCULAR | Status: DC | PRN
  Administered 2023-03-06: 100 ug via INTRAVENOUS

## 2023-03-06 MED ORDER — PROPOFOL 10 MG/ML IV BOLUS
INTRAVENOUS | Status: DC | PRN
  Administered 2023-03-06: 170 mg via INTRAVENOUS

## 2023-03-06 MED ORDER — SUGAMMADEX SODIUM 200 MG/2ML IV SOLN
INTRAVENOUS | Status: DC | PRN
  Administered 2023-03-06: 220 mg via INTRAVENOUS

## 2023-03-06 MED ORDER — CHLORHEXIDINE GLUCONATE 0.12 % MT SOLN
OROMUCOSAL | Status: AC
  Filled 2023-03-06: qty 15

## 2023-03-06 MED ORDER — LACTATED RINGERS IV SOLN: INTRAVENOUS | Status: DC

## 2023-03-06 MED ORDER — ONDANSETRON HCL 4 MG/2ML IJ SOLN
INTRAMUSCULAR | Status: DC | PRN
  Administered 2023-03-06: 4 mg via INTRAVENOUS

## 2023-03-06 MED ORDER — LIDOCAINE 2% (20 MG/ML) 5 ML SYRINGE
INTRAMUSCULAR | Status: DC | PRN
  Administered 2023-03-06: 100 mg via INTRAVENOUS

## 2023-03-06 MED ORDER — DEXAMETHASONE SODIUM PHOSPHATE 10 MG/ML IJ SOLN
INTRAMUSCULAR | Status: DC | PRN
  Administered 2023-03-06: 4 mg via INTRAVENOUS

## 2023-03-06 MED ORDER — CHLORHEXIDINE GLUCONATE 0.12 % MT SOLN
15.0000 mL | Freq: Once | OROMUCOSAL | Status: DC
  Filled 2023-03-06: qty 15

## 2023-03-06 MED ORDER — ROCURONIUM BROMIDE 10 MG/ML (PF) SYRINGE
PREFILLED_SYRINGE | INTRAVENOUS | Status: DC | PRN
  Administered 2023-03-06: 60 mg via INTRAVENOUS
  Administered 2023-03-06: 10 mg via INTRAVENOUS

## 2023-03-06 MED ORDER — INSULIN ASPART 100 UNIT/ML IJ SOLN: 0.0000 [IU] | INTRAMUSCULAR | Status: DC | PRN

## 2023-03-06 SURGICAL SUPPLY — 1 items: superlock fiducial IMPLANT

## 2023-03-06 NOTE — Op Note (Signed)
Video Bronchoscopy with Robotic Assisted Bronchoscopic Navigation   Date of Operation: 03/06/2023   Pre-op Diagnosis: Bilateral upper lobe pulmonary nodules  Post-op Diagnosis: Same  Surgeon: Baltazar Apo  Assistants: None  Anesthesia: General endotracheal anesthesia  Operation: Flexible video fiberoptic bronchoscopy with robotic assistance and biopsies.  Estimated Blood Loss: Minimal  Complications: None  Indications and History: Jeremy Hall is a 77 y.o. male with history of tobacco use.  He was found to have bilateral upper lobe pulmonary nodules that are slowly enlarging in size on CT chest 12/28/2022.  Recommendation made to achieve a tissue diagnosis via robotic assisted navigational bronchoscopy. The risks, benefits, complications, treatment options and expected outcomes were discussed with the patient.  The possibilities of pneumothorax, pneumonia, reaction to medication, pulmonary aspiration, perforation of a viscus, bleeding, failure to diagnose a condition and creating a complication requiring transfusion or operation were discussed with the patient who freely signed the consent.    Description of Procedure: The patient was seen in the Preoperative Area, was examined and was deemed appropriate to proceed.  The patient was taken to [facility], The endoscopy room 3, identified as Jeremy Hall and the procedure verified as Flexible Video Fiberoptic Bronchoscopy.  A Time Out was held and the above information confirmed.   Prior to the date of the procedure a high-resolution CT scan of the chest was performed. Utilizing ION software program a virtual tracheobronchial tree was generated to allow the creation of distinct navigation pathways to the patient's parenchymal abnormalities. After being taken to the operating room general anesthesia was initiated and the patient  was orally intubated. The video fiberoptic bronchoscope was introduced via the endotracheal tube and a general inspection  was performed which showed normal right and left lung anatomy. Aspiration of the bilateral mainstems was completed to remove any remaining secretions. Robotic catheter inserted into patient's endotracheal tube.   Target #1 left upper lobe pulmonary nodule: The distinct navigation pathways prepared prior to this procedure were then utilized to navigate to patient's lesion identified on CT scan. The robotic catheter was secured into place and the vision probe was withdrawn.  Lesion location was approximated using fluoroscopy and radial endobronchial ultrasound for peripheral targeting.  Local registration and targeting was performed using Cios three-dimensional imaging.  Under fluoroscopic guidance transbronchial brushings, transbronchial needle biopsies, and transbronchial forceps biopsies were performed to be sent for cytology and pathology.  Under fluoroscopic guidance a single fiducial marker was placed adjacent to the nodule.   Target #2 right upper lobe pulmonary nodule: The distinct navigation pathways prepared prior to this procedure were then utilized to navigate to patient's lesion identified on CT scan. The robotic catheter was secured into place and the vision probe was withdrawn.  Lesion location was approximated using fluoroscopy and radial endobronchial ultrasound for peripheral targeting.  Local registration and targeting was performed using Cios three-dimensional imaging.  Under fluoroscopic guidance transbronchial needle brushings, transbronchial needle biopsies, and transbronchial forceps biopsies were performed to be sent for cytology and pathology.  Needle-in-lesion was established using Cios Spin.  Under fluoroscopic guidance a single fiducial marker was placed adjacent to the lesion.  A bronchioalveolar lavage was performed in the right upper lobe and sent for cytology.  At the end of the procedure a general airway inspection was performed and there was no evidence of active bleeding.  The bronchoscope was removed.  The patient tolerated the procedure well. There was no significant blood loss and there were no obvious complications. A post-procedural chest  x-ray is pending.  Samples Target #1: 1. Transbronchial brushings from left upper lobe nodule 2. Transbronchial Wang needle biopsies from left upper lobe nodule 3. Transbronchial forceps biopsies from left upper lobe nodule  Samples Target #2: 1. Transbronchial needle brushings from right upper lobe nodule 2. Transbronchial Wang needle biopsies from right upper lobe nodule 3. Transbronchial forceps biopsies from right upper lobe nodule 4. Bronchoalveolar lavage from right upper lobe  Plans:  The patient will be discharged from the PACU to home when recovered from anesthesia and after chest x-ray is reviewed. We will review the cytology, pathology and microbiology results with the patient when they become available. Outpatient followup will be with Dr. Lamonte Sakai.   Baltazar Apo, MD, PhD 03/06/2023, 10:56 AM [facility] [phone] or if no answer before 7:00PM call [phone] For any issues after 7:00PM please call eLink [phone]

## 2023-03-06 NOTE — Interval H&P Note (Signed)
History and Physical Interval Note:  03/06/2023 7:23 AM  Jeremy Hall  has presented today for surgery, with the diagnosis of Hunt.  The various methods of treatment have been discussed with the patient and family. After consideration of risks, benefits and other options for treatment, the patient has consented to  Procedure(s): ROBOTIC ASSISTED NAVIGATIONAL BRONCHOSCOPY (Bilateral) as a surgical intervention.  The patient's history has been reviewed, patient examined, no change in status, stable for surgery.  I have reviewed the patient's chart and labs.  He has not had his super D CT chest performed yet.  He needs it done this morning and we will arrange prior to his procedure.  Questions were answered to the patient's satisfaction.     Collene Gobble

## 2023-03-06 NOTE — Transfer of Care (Signed)
Immediate Anesthesia Transfer of Care Note  Patient: Jeremy Hall  Procedure(s) Performed: ROBOTIC ASSISTED NAVIGATIONAL BRONCHOSCOPY (Bilateral) BRONCHIAL NEEDLE ASPIRATION BIOPSIES BRONCHIAL BRUSHINGS BRONCHIAL BIOPSIES FIDUCIAL MARKER PLACEMENT BRONCHIAL WASHINGS  Patient Location: PACU  Anesthesia Type:General  Level of Consciousness: awake, alert , and oriented  Airway & Oxygen Therapy: Patient Spontanous Breathing  Post-op Assessment: Report given to RN, Post -op Vital signs reviewed and stable, and Patient moving all extremities  Post vital signs: Reviewed and stable  Last Vitals:  Vitals Value Taken Time  BP 158/85 03/06/23 1056  Temp    Pulse 81 03/06/23 1058  Resp 18 03/06/23 1058  SpO2 93 % 03/06/23 1058  Vitals shown include unvalidated device data.  Last Pain:  Vitals:   03/06/23 0626  TempSrc: Oral  PainSc:          Complications: No notable events documented.

## 2023-03-06 NOTE — Anesthesia Postprocedure Evaluation (Signed)
Anesthesia Post Note  Patient: Dequavius Cure  Procedure(s) Performed: ROBOTIC ASSISTED NAVIGATIONAL BRONCHOSCOPY (Bilateral) BRONCHIAL NEEDLE ASPIRATION BIOPSIES BRONCHIAL BRUSHINGS BRONCHIAL BIOPSIES FIDUCIAL MARKER PLACEMENT BRONCHIAL WASHINGS     Patient location during evaluation: PACU Anesthesia Type: General Level of consciousness: awake and alert Pain management: pain level controlled Vital Signs Assessment: post-procedure vital signs reviewed and stable Respiratory status: spontaneous breathing, nonlabored ventilation and respiratory function stable Cardiovascular status: blood pressure returned to baseline Postop Assessment: no apparent nausea or vomiting Anesthetic complications: no   No notable events documented.  Last Vitals:  Vitals:   03/06/23 1130 03/06/23 1145  BP: 129/71 135/68  Pulse: 68 62  Resp: 19 (!) 21  Temp:    SpO2: 100% 98%    Last Pain:  Vitals:   03/06/23 1100  TempSrc:   PainSc: 0-No pain                 Marthenia Rolling

## 2023-03-06 NOTE — Discharge Instructions (Signed)
Flexible Bronchoscopy, Care After This sheet gives you information about how to care for yourself after your test. Your doctor may also give you more specific instructions. If you have problems or questions, contact your doctor. Follow these instructions at home: Eating and drinking When your numbness is gone and your cough and gag reflexes have come back, you may: Eat only soft foods. Slowly drink liquids. The day after the test, go back to your normal diet. Driving Do not drive for 24 hours if you were given a medicine to help you relax (sedative). Do not drive or use heavy machinery while taking prescription pain medicine. General instructions  Take over-the-counter and prescription medicines only as told by your doctor. Return to your normal activities as told. Ask what activities are safe for you. Do not use any products that have nicotine or tobacco in them. This includes cigarettes and e-cigarettes. If you need help quitting, ask your doctor. Keep all follow-up visits as told by your doctor. This is important. It is very important if you had a tissue sample (biopsy) taken. Get help right away if: You have shortness of breath that gets worse. You get light-headed. You feel like you are going to pass out (faint). You have chest pain. You cough up: More than a little blood. More blood than before. Summary Do not eat or drink anything (not even water) for 2 hours after your test, or until your numbing medicine wears off. Do not use cigarettes. Do not use e-cigarettes. Get help right away if you have chest pain.  Please call our office for any questions or concerns.  [phone].  Okay to restart your aspirin on 03/07/2023.  This information is not intended to replace advice given to you by your health care provider. Make sure you discuss any questions you have with your health care provider. Document Released: 10/09/2009 Document Revised: 11/24/2017 Document Reviewed:  12/30/2016 Elsevier Patient Education  2020 [employer].

## 2023-03-06 NOTE — Anesthesia Procedure Notes (Signed)
Procedure Name: Intubation Date/Time: 03/06/2023 9:18 AM  Performed by: Amadeo Garnet, CRNAPre-anesthesia Checklist: Patient identified, Emergency Drugs available, Suction available and Patient being monitored Patient Re-evaluated:Patient Re-evaluated prior to induction Oxygen Delivery Method: Circle system utilized Preoxygenation: Pre-oxygenation with 100% oxygen Induction Type: IV induction Ventilation: Mask ventilation without difficulty Laryngoscope Size: Mac and 4 Grade View: Grade I Tube type: Oral Tube size: 8.5 mm Number of attempts: 1 Airway Equipment and Method: Stylet and Oral airway Placement Confirmation: ETT inserted through vocal cords under direct vision, positive ETCO2 and breath sounds checked- equal and bilateral Secured at: 23 cm Tube secured with: Tape Dental Injury: Teeth and Oropharynx as per pre-operative assessment

## 2023-03-10 ENCOUNTER — Encounter (HOSPITAL_COMMUNITY): Payer: Self-pay | Admitting: Emergency Medicine

## 2023-03-10 ENCOUNTER — Telehealth: Payer: Self-pay | Admitting: Emergency Medicine

## 2023-03-10 DIAGNOSIS — C3490 Malignant neoplasm of unspecified part of unspecified bronchus or lung: Secondary | ICD-10-CM

## 2023-03-10 LAB — CYTOLOGY - NON PAP

## 2023-03-10 NOTE — Telephone Encounter (Signed)
Spoke with the patient to review his cytology results.  Biopsy show squamous cell lung cancer.  Will order PET scan, MRI brain.  He had pulmonary function testing in 2021 that suggest that he could possibly be a candidate for surgical resection.  Other option would be stereotactic radiation if this turns out to be stage I.  I will refer him to see Dr. Julien Nordmann with oncology to plan next steps.

## 2023-03-10 NOTE — Telephone Encounter (Signed)
Please see last tel encounter (signed). PT got a call from [facility] to set up a brain scan and he is perplexed as to why. Dr's B's note will clarify why. I did not discuss w/PT.  Pls call PT @ [phone]

## 2023-03-10 NOTE — Telephone Encounter (Signed)
I explained to him that I needed to order scans to complete the cancer staging workup. I must not have explained well that he needs MRI brain and PET scan for this purpose.

## 2023-03-10 NOTE — Telephone Encounter (Signed)
Dr. Lamonte Sakai can you please clarify as to why patient needs MRI. I see where you discussed biopsy results with him but he advises a brain MRI was never mentioned and is very confused on why this was ordered

## 2023-03-13 ENCOUNTER — Telehealth: Payer: Self-pay | Admitting: Internal Medicine

## 2023-03-13 NOTE — Telephone Encounter (Signed)
Spoke with patient. Advised patient he does need to complete MRI to completed cancer staging work up. Patient verbalized understanding and plans to call hospital back to get scheduled. Nothing further needed.

## 2023-03-13 NOTE — Telephone Encounter (Signed)
scheduled per referral, pt has been called and confirmed date and time. Pt is aware of location and to arrive early for check in   

## 2023-03-16 ENCOUNTER — Other Ambulatory Visit: Payer: Self-pay

## 2023-03-16 DIAGNOSIS — C3490 Malignant neoplasm of unspecified part of unspecified bronchus or lung: Secondary | ICD-10-CM

## 2023-03-16 NOTE — Progress Notes (Signed)
The proposed treatment discussed in conference is for discussion purpose only and is not a binding recommendation.  The patients have not been physically examined, or presented with their treatment options.  Therefore, final treatment plans cannot be decided.  

## 2023-03-17 ENCOUNTER — Other Ambulatory Visit: Payer: Medicare Other

## 2023-03-17 ENCOUNTER — Ambulatory Visit: Payer: Medicare Other | Admitting: Internal Medicine

## 2023-03-17 NOTE — Progress Notes (Deleted)
[place] [phone]   [phone]  CONSULT NOTE  REFERRING PHYSICIAN: Dr. Baltazar Apo  REASON FOR CONSULTATION:  77 years old African-American male recently diagnosed with lung cancer.  HPI Jeremy Hall is a 77 y.o. male with past medical history significant for osteoarthritis, COPD, diabetes mellitus, GERD, hypertension, dyslipidemia, peripheral vascular disease as well as GERD and long history for smoking.  The patient was followed by the CT lung screening program and he had CT of the chest performed on December 28, 2022 and that showed spiculated nodule in the right upper lobe measuring 2.2 cm and a spiculated nodule of the left upper lobe measuring 1.9 cm increased in size when compared to the previous exams and highly concerning for primary lung malignancy likely metachronous multiple primary malignancy.  The patient had CT super D of the chest on March 06, 2023 and that showed spiculated nodule medially in the right upper lobe with minimal central cavitation measuring 2.4 x 1.6 cm.  The spiculated left upper lobe nodule along the major fissure measures 1.9 x 1.3 cm.  This nodule may cross the fissure.  There was a small subpleural nodule in the right lower lobe measuring 0.5 cm unchanged.  On March 06, 2023 the patient underwent flexible video fiberoptic bronchoscopy with robotic assistance and biopsies under the care of Dr. Lamonte Sakai.  The final pathology from the fine-needle aspiration of the left upper lobe [id number]) showed malignant cells consistent with squamous cell carcinoma. Immunostain meals stains show that the tumor cells are positive for p63 and CK5/6 while they are negative for TTF-1 synaptophysin and CD56, consistent with above interpretation.  The fine-needle aspiration as well as the brushing of the right upper lobe lung nodule showed atypical cells. Dr. Lamonte Sakai kindly referred the patient to me today for evaluation and recommendation  regarding treatment of his condition. When seen today HPI  Past Medical History:  Diagnosis Date   Arthritis    osteo   COPD (chronic obstructive pulmonary disease) ([place])    emphysema - per notes from Dr. Lavone Orn. Pt denies this.   Diabetes mellitus without complication (HCC)    diet controlled   Dyspnea    GERD (gastroesophageal reflux disease)    High cholesterol    Hypertension    Hypertensive retinopathy    Peripheral vascular disease ([place])    Pulmonary nodule     Past Surgical History:  Procedure Laterality Date   APPENDECTOMY     BRONCHIAL BIOPSY  03/06/2023   Procedure: BRONCHIAL BIOPSIES;  Surgeon: Collene Gobble, MD;  Location: [facility];  Service: Pulmonary;;   BRONCHIAL BRUSHINGS  03/06/2023   Procedure: BRONCHIAL BRUSHINGS;  Surgeon: Collene Gobble, MD;  Location: [facility] ENDOSCOPY;  Service: Pulmonary;;   BRONCHIAL NEEDLE ASPIRATION BIOPSY  03/06/2023   Procedure: BRONCHIAL NEEDLE ASPIRATION BIOPSIES;  Surgeon: Collene Gobble, MD;  Location: [facility];  Service: Pulmonary;;   BRONCHIAL WASHINGS  03/06/2023   Procedure: BRONCHIAL WASHINGS;  Surgeon: Collene Gobble, MD;  Location: [facility] ENDOSCOPY;  Service: Pulmonary;;   CHOLECYSTECTOMY     FIDUCIAL MARKER PLACEMENT  03/06/2023   Procedure: FIDUCIAL MARKER PLACEMENT;  Surgeon: Collene Gobble, MD;  Location: [facility] ENDOSCOPY;  Service: Pulmonary;;    Family History  Problem Relation Age of Onset   Cancer Mother     Social History Social History   Tobacco Use   Smoking status: Every Day    Packs/day: 0.50    Years:  35.00    Additional pack years: 0.00    Total pack years: 17.50    Types: Cigarettes   Smokeless tobacco: Never   Tobacco comments:    1-2 cig/day as of 08/18/2020  Substance Use Topics   Alcohol use: Not Currently   Drug use: Never    No Known Allergies  Current Outpatient Medications  Medication Sig Dispense Refill   acetaminophen (TYLENOL) 500 MG tablet Take 500 mg by mouth every 8  (eight) hours as needed for mild pain or moderate pain.     albuterol (VENTOLIN HFA) 108 (90 Base) MCG/ACT inhaler Inhale 2 puffs into the lungs every 6 (six) hours as needed for wheezing or shortness of breath.     aspirin EC 81 MG tablet Take 81 mg by mouth daily.     famotidine (PEPCID) 10 MG tablet Take 10 mg by mouth 2 (two) times daily as needed for heartburn or indigestion.     Fluticasone-Umeclidin-Vilant (TRELEGY ELLIPTA) 100-62.5-25 MCG/INH AEPB Inhale 1 puff into the lungs daily. (Patient not taking: Reported on 08/18/2020) 14 each 0   pravastatin (PRAVACHOL) 40 MG tablet Take 40 mg by mouth daily.     Tiotropium Bromide-Olodaterol (STIOLTO RESPIMAT) 2.5-2.5 MCG/ACT AERS Inhale 1 each into the lungs daily.     valsartan-hydrochlorothiazide (DIOVAN-HCT) 160-25 MG tablet Take 1 tablet by mouth daily.     No current facility-administered medications for this visit.    Review of Systems  {Ros - complete:30496}  Physical Exam  RAL:{CHL ONC PE GENERAL:[phone]} SKIN: {CHL ONC PE [id number] HEAD: {CHL ONC PE AE:[id number] EYES: {CHL ONC PE EY:[id number] EARS: {CHL ONC PE AC:[id number] OROPHARYNX:{CHL ONC PE OROPHARYNX:[phone]}  NECK: {CHL ONC PE FZ:[id number] LYMPH:  {CHL ONC PE RP:[id number] BREAST:{CHL ONC PE BREAST:[phone]} LUNGS: {CHL ONC PE LK:[id number] HEART: {CHL ONC PE LA:[id number] ABDOMEN:{CHL ONC PE ABDOMEN:[phone]} BACK: {CHL ONC PE YK:[id number] EXTREMITIES:{CHL ONC PE [phone]}  NEURO: {CHL ONC PE NEURO:[id number]}  PERFORMANCE STATUS: ECOG ***  LABORATORY DATA: Lab Results  Component Value Date   WBC 11.3 (H) 03/06/2023   HGB 16.0 03/06/2023   HCT 49.5 03/06/2023   MCV 91.0 03/06/2023   PLT 384 03/06/2023      Chemistry      Component Value Date/Time   NA 137 03/06/2023 0612   K 4.6 03/06/2023 0612   CL 102 03/06/2023 0612   CO2 23 03/06/2023 0612   BUN 21 03/06/2023 0612   CREATININE  1.16 03/06/2023 0612      Component Value Date/Time   CALCIUM 9.4 03/06/2023 0612       RADIOGRAPHIC STUDIES: DG Chest Port 1 View  Result Date: 03/06/2023 CLINICAL DATA:  Provided history: Status post bronchoscopy with biopsy. EXAM: PORTABLE CHEST 1 VIEW COMPARISON:  Chest CT 03/06/2023. FINDINGS: Heart size within normal limits. Aortic atherosclerosis. Known pulmonary nodules within the bilateral upper lobes, better delineated on the recent prior chest CT of 03/06/2023. Subtle ill-defined opacities within the mid lungs bilaterally, favored to reflect atelectasis. No evidence of pleural effusion or pneumothorax. No acute osseous abnormality identified. Degenerative changes of the spine. IMPRESSION: 1. Known bilateral upper lobe pulmonary nodules, better delineated on the recent prior chest CT of 03/06/2023. 2. Subtle ill-defined opacities within the mid lungs bilaterally, favored to reflect atelectasis. 3. No evidence of pneumothorax. 4. Aortic Atherosclerosis (ICD10-I70.0). Electronically Signed   By: Kellie Simmering D.O.   On: 03/06/2023 11:49   DG C-ARM BRONCHOSCOPY  Result Date: 03/06/2023 C-ARM BRONCHOSCOPY: Fluoroscopy was utilized by the requesting physician.  No radiographic interpretation.   CT Super D Chest Wo Contrast  Result Date: 03/06/2023 CLINICAL DATA:  Bronchoscopy planning. Spiculated bilateral upper lobe pulmonary nodules on chest CT. * Tracking Code: BO * EXAM: CT CHEST WITHOUT CONTRAST TECHNIQUE: Multidetector CT imaging of the chest was performed using thin slice collimation for electromagnetic bronchoscopy planning purposes, without intravenous contrast. RADIATION DOSE REDUCTION: This exam was performed according to the departmental dose-optimization program which includes automated exposure control, adjustment of the mA and/or kV according to patient size and/or use of iterative reconstruction technique. COMPARISON:  Chest CT 12/28/2022 and 08/05/2020.  PET-CT 05/05/2020.  FINDINGS: Cardiovascular: Atherosclerosis of the aorta, great vessels and coronary arteries. There are calcifications of the aortic valve. The heart size is normal. There is no pericardial effusion. Mediastinum/Nodes: There are no enlarged mediastinal, hilar or axillary lymph nodes.Hilar assessment is limited by the lack of intravenous contrast, although the hilar contours appear unchanged. The thyroid gland, trachea and esophagus demonstrate no significant findings. Lungs/Pleura: No pleural effusion or pneumothorax. Mild centrilobular emphysema with mild diffuse central airway thickening. Spiculated nodule medially in the right upper lobe demonstrates minimal central cavitation and measures 2.4 x 1.6 cm on image 45/4 (2.1 cm maximally on most recent study). The spiculated left upper lobe nodule along the major fissure measures 1.9 x 1.3 cm on image 79/4 (similar to recent prior study). This nodule may cross the fissure. A small subpleural nodule in the right lower lobe measuring 5 mm on image 87/4 is unchanged. No other suspicious pulmonary nodules. Upper abdomen: The visualized upper abdomen appears stable without suspicious findings. Musculoskeletal/Chest wall: There is no chest wall mass or suspicious osseous finding. Mild spondylosis. IMPRESSION: 1. Imaging for bronchoscopy planning and guidance. 2. Spiculated right upper lobe and left upper lobe pulmonary nodules are similar to recent prior study, although have mildly increased in size from the most recent study. These remain suspicious for bronchogenic carcinoma. 3. No evidence of thoracic adenopathy or pleural effusion. 4. Aortic Atherosclerosis (ICD10-I70.0) and Emphysema (ICD10-J43.9). Electronically Signed   By: Richardean Sale M.D.   On: 03/06/2023 08:06    ASSESSMENT:   PLAN:  The patient voices understanding of current disease status and treatment options and is in agreement with the current care plan.  All questions were answered. The patient  knows to call the clinic with any problems, questions or concerns. We can certainly see the patient much sooner if necessary.  Thank you so much for allowing me to participate in the care of Qu[place] Erice Alatorre. I will continue to follow up the patient with you and assist in his care.  I spent {CHL ONC TIME VISIT - [id number] counseling the patient face to face. The total time spent in the appointment was {CHL ONC TIME VISIT - ZX:[id number].  Disclaimer: This note was dictated with voice recognition software. Similar sounding words can inadvertently be transcribed and may not be corrected upon review.   Eilleen Kempf March 17, 2023, 9:35 AM

## 2023-03-17 NOTE — Progress Notes (Signed)
Navigator called patient when he didn't show up for 10:00 appt with Dr.Mohamed to make sure he was okay. Pt states he had left a message informing the clinic that he couldn't make it today. Navigator asked the pt if he could come to the clinic on 3/27@ 2:15 for an appt with Dr.Mohamed. Pt verifed that time/day would work.  Scheduler notified that the pt can make the appt on that day/time and requested the scheduler call the pt to confirm the appt has been booked.  Navigator will call the pt the day prior to remind him of his appointment.

## 2023-03-21 NOTE — Progress Notes (Signed)
Navigator called the pt today to remind him of his appts tomorrow @ 1:45pm for labs and 2:15pm with Dr Julien Nordmann. Pt states he won't be able to get to the appt because he won't have transportation. Navigator informed pt that [place] has a transportation service. Pt expressed interest in utilizing the assistance. Navigator provided Solicitor, Magda Paganini, with pts information and states that she will reach out to the pt to make arrangements.

## 2023-03-22 ENCOUNTER — Inpatient Hospital Stay: Payer: Medicare Other | Attending: Internal Medicine

## 2023-03-22 ENCOUNTER — Other Ambulatory Visit: Payer: Self-pay

## 2023-03-22 ENCOUNTER — Inpatient Hospital Stay (HOSPITAL_BASED_OUTPATIENT_CLINIC_OR_DEPARTMENT_OTHER): Payer: Medicare Other | Admitting: Internal Medicine

## 2023-03-22 VITALS — BP 163/75 | HR 92 | Temp 97.6°F | Resp 17 | Wt 244.9 lb

## 2023-03-22 DIAGNOSIS — F17210 Nicotine dependence, cigarettes, uncomplicated: Secondary | ICD-10-CM

## 2023-03-22 DIAGNOSIS — C3432 Malignant neoplasm of lower lobe, left bronchus or lung: Secondary | ICD-10-CM | POA: Diagnosis not present

## 2023-03-22 DIAGNOSIS — I10 Essential (primary) hypertension: Secondary | ICD-10-CM | POA: Insufficient documentation

## 2023-03-22 DIAGNOSIS — C3490 Malignant neoplasm of unspecified part of unspecified bronchus or lung: Secondary | ICD-10-CM

## 2023-03-22 DIAGNOSIS — Z79899 Other long term (current) drug therapy: Secondary | ICD-10-CM | POA: Diagnosis not present

## 2023-03-22 DIAGNOSIS — Z7982 Long term (current) use of aspirin: Secondary | ICD-10-CM | POA: Insufficient documentation

## 2023-03-22 DIAGNOSIS — C3412 Malignant neoplasm of upper lobe, left bronchus or lung: Secondary | ICD-10-CM | POA: Insufficient documentation

## 2023-03-22 DIAGNOSIS — E7800 Pure hypercholesterolemia, unspecified: Secondary | ICD-10-CM | POA: Diagnosis not present

## 2023-03-22 LAB — COMPREHENSIVE METABOLIC PANEL
ALT: 15 U/L (ref 0–44)
AST: 20 U/L (ref 15–41)
Albumin: 3.9 g/dL (ref 3.5–5.0)
Alkaline Phosphatase: 81 U/L (ref 38–126)
Anion gap: 7 (ref 5–15)
BUN: 19 mg/dL (ref 8–23)
CO2: 31 mmol/L (ref 22–32)
Calcium: 9.4 mg/dL (ref 8.9–10.3)
Chloride: 100 mmol/L (ref 98–111)
Creatinine, Ser: 1.13 mg/dL (ref 0.61–1.24)
GFR, Estimated: >60 mL/min (ref >60)
Glucose, Bld: 122 mg/dL — ABNORMAL HIGH (ref 70–99)
Potassium: 4.4 mmol/L (ref 3.5–5.1)
Sodium: 138 mmol/L (ref 135–145)
Total Bilirubin: 0.8 mg/dL (ref 0.3–1.2)
Total Protein: 7.8 g/dL (ref 6.5–8.1)

## 2023-03-22 LAB — CBC WITH DIFFERENTIAL/PLATELET
Abs Immature Granulocytes: 0.02 10*3/uL (ref 0.00–0.07)
Basophils Absolute: 0.1 10*3/uL (ref 0.0–0.1)
Basophils Relative: 1 %
Eosinophils Absolute: 0.5 10*3/uL (ref 0.0–0.5)
Eosinophils Relative: 5 %
HCT: 48.3 % (ref 39.0–52.0)
Hemoglobin: 16 g/dL (ref 13.0–17.0)
Immature Granulocytes: 0 %
Lymphocytes Relative: 23 %
Lymphs Abs: 2.3 10*3/uL (ref 0.7–4.0)
MCH: 30.1 pg (ref 26.0–34.0)
MCHC: 33.1 g/dL (ref 30.0–36.0)
MCV: 91 fL (ref 80.0–100.0)
Monocytes Absolute: 0.9 10*3/uL (ref 0.1–1.0)
Monocytes Relative: 9 %
Neutro Abs: 6.2 10*3/uL (ref 1.7–7.7)
Neutrophils Relative %: 62 %
Platelets: 362 10*3/uL (ref 150–400)
RBC: 5.31 MIL/uL (ref 4.22–5.81)
RDW: 16.2 % — ABNORMAL HIGH (ref 11.5–15.5)
WBC: 9.9 10*3/uL (ref 4.0–10.5)
nRBC: 0 % (ref 0.0–0.2)

## 2023-03-22 NOTE — Progress Notes (Signed)
Today was the pt's med onc consult with Dr.Mohamed. He was not accompanied by a support person.  The treatment plan for the patient is going to be radiation. Dr.Mohamed placed a referral to Radiation Oncology. Navigator will coordinate.  Major barrier for the pt is transportation. Pt says his missed his first appointment on 3/22 because he didn't have a ride. Navigator was able to arrange for transportation for the patient for today's appointment. Pt states he does drive, but he car doesn't always work. Navigator asked to be notified by the by Friday morning if he will need a ride to his imaging appointments on Monday 4/1. Pt verbalized understanding. Navigator will call the pt on Friday morning to confirm whether or not he needs rides to his appointments. Pt denies any financial concerns or issues with access to food. Pt states his children nearby, but was vague about what kind of support system he has.  Pt is to have a CT scan in 3-36mos and a follow up appointment soon after. Pt verbalized understanding.  Pt provided his AVS and he upcoming appointment for MRI and PET scan reviewed. Pt is aware of date, times, and location.

## 2023-03-22 NOTE — Progress Notes (Signed)
[place] [phone]   [phone]  CONSULT NOTE  REFERRING PHYSICIAN: Dr. Baltazar Apo   REASON FOR CONSULTATION:  76 years old African-American male recently diagnosed with lung cancer.  HPI Jeremy Hall is a 77 y.o. male with past medical history significant for hypertension, COPD, diabetes mellitus, osteoarthritis, GERD, dyslipidemia, peripheral neuropathy and long history of smoking.  Was followed by CT screening of the chest for several years.  CT scan of the chest on April 09, 2020 showed multiple pulmonary nodules noted throughout the lungs bilaterally that were stable except for a microlobulated and extensively spiculated nodule in association with the left major fissure that has been slowly growing.  With a volume drip and mean diameter of 13.2 mm.  He had a PET scan on 05/05/2020 and it showed 1.1 cm irregular left lung nodule along the fissure compatible with primary bronchogenic neoplasm and no findings suspicious for metastatic disease.  He was followed by observation and repeat CT screening of the chest on December 28, 2022 showed spiculated nodule of the right upper lobe measuring 2.2 cm and spiculated nodule of the left lower lobe measuring 1.9 cm increased in size compared with the prior exam and highly concerning for primary lung malignancy likely metachronous multiple primary malignancies.  On March 06, 2023 the patient underwent Video bronchoscopy with robotic assisted bronchoscopic navigation under the care of Dr. Lamonte Sakai and the final pathology [id number]) showed malignant cells consistent with squamous cell carcinoma from the left upper lobe fine-needle aspiration and brushing.  The fine-needle aspiration and brushing of the right upper lobe lung nodule showed atypical cells. The patient was referred to me today for evaluation and recommendation regarding treatment of his condition. When seen today the patient is feeling fine with no  concerning complaints except for pain on the left knee as well as shortness of breath with exertion and cough productive of clear sputum.  He has no chest pain or hemoptysis.  He has no nausea, vomiting, diarrhea or constipation.  He has no headache or visual changes.  He denied having any significant weight loss or night sweats. Family history significant for mother died from stomach cancer and father died from alcoholic complication. The patient is single and has 4 children 1 son and 3 daughters.  He used to work as a Development worker, community.  He has a history of smoking 1 pack/day for around 57 years and unfortunately he continues to smoke.  He is a recovering alcoholic and no history of drug abuse.  HPI  Past Medical History:  Diagnosis Date   Arthritis    osteo   COPD (chronic obstructive pulmonary disease) ([place])    emphysema - per notes from Dr. Lavone Orn. Pt denies this.   Diabetes mellitus without complication (H[facility])    diet controlled   Dyspnea    GERD (gastroesophageal reflux disease)    High cholesterol    Hypertension    Hypertensive retinopathy    Peripheral vascular disease ([place])    Pulmonary nodule     Past Surgical History:  Procedure Laterality Date   APPENDECTOMY     BRONCHIAL BIOPSY  03/06/2023   Procedure: BRONCHIAL BIOPSIES;  Surgeon: Collene Gobble, MD;  Location: [facility];  Service: Pulmonary;;   BRONCHIAL BRUSHINGS  03/06/2023   Procedure: BRONCHIAL BRUSHINGS;  Surgeon: Collene Gobble, MD;  Location: [facility] ENDOSCOPY;  Service: Pulmonary;;   BRONCHIAL NEEDLE ASPIRATION BIOPSY  03/06/2023   Procedure: BRONCHIAL NEEDLE  ASPIRATION BIOPSIES;  Surgeon: Collene Gobble, MD;  Location: [facility] ENDOSCOPY;  Service: Pulmonary;;   BRONCHIAL WASHINGS  03/06/2023   Procedure: BRONCHIAL WASHINGS;  Surgeon: Collene Gobble, MD;  Location: [facility] ENDOSCOPY;  Service: Pulmonary;;   CHOLECYSTECTOMY     FIDUCIAL MARKER PLACEMENT  03/06/2023   Procedure: FIDUCIAL MARKER PLACEMENT;   Surgeon: Collene Gobble, MD;  Location: [facility] ENDOSCOPY;  Service: Pulmonary;;    Family History  Problem Relation Age of Onset   Cancer Mother     Social History Social History   Tobacco Use   Smoking status: Every Day    Packs/day: 0.50    Years: 35.00    Additional pack years: 0.00    Total pack years: 17.50    Types: Cigarettes   Smokeless tobacco: Never   Tobacco comments:    1-2 cig/day as of 08/18/2020  Substance Use Topics   Alcohol use: Not Currently   Drug use: Never    No Known Allergies  Current Outpatient Medications  Medication Sig Dispense Refill   acetaminophen (TYLENOL) 500 MG tablet Take 500 mg by mouth every 8 (eight) hours as needed for mild pain or moderate pain.     albuterol (VENTOLIN HFA) 108 (90 Base) MCG/ACT inhaler Inhale 2 puffs into the lungs every 6 (six) hours as needed for wheezing or shortness of breath.     aspirin EC 81 MG tablet Take 81 mg by mouth daily.     famotidine (PEPCID) 10 MG tablet Take 10 mg by mouth 2 (two) times daily as needed for heartburn or indigestion.     Fluticasone-Umeclidin-Vilant (TRELEGY ELLIPTA) 100-62.5-25 MCG/INH AEPB Inhale 1 puff into the lungs daily. (Patient not taking: Reported on 08/18/2020) 14 each 0   pravastatin (PRAVACHOL) 40 MG tablet Take 40 mg by mouth daily.     Tiotropium Bromide-Olodaterol (STIOLTO RESPIMAT) 2.5-2.5 MCG/ACT AERS Inhale 1 each into the lungs daily.     valsartan-hydrochlorothiazide (DIOVAN-HCT) 160-25 MG tablet Take 1 tablet by mouth daily.     No current facility-administered medications for this visit.    Review of Systems  Constitutional: positive for fatigue Eyes: negative Ears, nose, mouth, throat, and face: negative Respiratory: positive for dyspnea on exertion Cardiovascular: negative Gastrointestinal: negative Genitourinary:negative Integument/breast: negative Hematologic/lymphatic: negative Musculoskeletal:positive for arthralgias Neurological:  negative Behavioral/Psych: negative Endocrine: negative Allergic/Immunologic: negative  Physical Exam  [id number], healthy, no distress, well nourished, and well developed SKIN: skin color, texture, turgor are normal, no rashes or significant lesions HEAD: Normocephalic, No masses, lesions, tenderness or abnormalities EYES: normal, PERRLA, Conjunctiva are pink and non-injected EARS: External ears normal, Canals clear OROPHARYNX:no exudate, no erythema, and lips, buccal mucosa, and tongue normal  NECK: supple, no adenopathy, no JVD LYMPH:  no palpable lymphadenopathy, no hepatosplenomegaly LUNGS: clear to auscultation , and palpation HEART: regular rate & rhythm, no murmurs, and no gallops ABDOMEN:abdomen soft, non-tender, normal bowel sounds, and no masses or organomegaly BACK: Back symmetric, no curvature., No CVA tenderness EXTREMITIES:no joint deformities, effusion, or inflammation, no edema  NEURO: alert & oriented x 3 with fluent speech, no focal motor/sensory deficits  PERFORMANCE STATUS: ECOG 1  LABORATORY DATA: Lab Results  Component Value Date   WBC 9.9 03/22/2023   HGB 16.0 03/22/2023   HCT 48.3 03/22/2023   MCV 91.0 03/22/2023   PLT 362 03/22/2023      Chemistry      Component Value Date/Time   NA 137 03/06/2023 0612   K 4.6 03/06/2023 0612  CL 102 03/06/2023 0612   CO2 23 03/06/2023 0612   BUN 21 03/06/2023 0612   CREATININE 1.16 03/06/2023 0612      Component Value Date/Time   CALCIUM 9.4 03/06/2023 0612       RADIOGRAPHIC STUDIES: DG Chest Port 1 View  Result Date: 03/06/2023 CLINICAL DATA:  Provided history: Status post bronchoscopy with biopsy. EXAM: PORTABLE CHEST 1 VIEW COMPARISON:  Chest CT 03/06/2023. FINDINGS: Heart size within normal limits. Aortic atherosclerosis. Known pulmonary nodules within the bilateral upper lobes, better delineated on the recent prior chest CT of 03/06/2023. Subtle ill-defined opacities within the mid lungs  bilaterally, favored to reflect atelectasis. No evidence of pleural effusion or pneumothorax. No acute osseous abnormality identified. Degenerative changes of the spine. IMPRESSION: 1. Known bilateral upper lobe pulmonary nodules, better delineated on the recent prior chest CT of 03/06/2023. 2. Subtle ill-defined opacities within the mid lungs bilaterally, favored to reflect atelectasis. 3. No evidence of pneumothorax. 4. Aortic Atherosclerosis (ICD10-I70.0). Electronically Signed   By: Kellie Simmering D.O.   On: 03/06/2023 11:49   DG C-ARM BRONCHOSCOPY  Result Date: 03/06/2023 C-ARM BRONCHOSCOPY: Fluoroscopy was utilized by the requesting physician.  No radiographic interpretation.   CT Super D Chest Wo Contrast  Result Date: 03/06/2023 CLINICAL DATA:  Bronchoscopy planning. Spiculated bilateral upper lobe pulmonary nodules on chest CT. * Tracking Code: BO * EXAM: CT CHEST WITHOUT CONTRAST TECHNIQUE: Multidetector CT imaging of the chest was performed using thin slice collimation for electromagnetic bronchoscopy planning purposes, without intravenous contrast. RADIATION DOSE REDUCTION: This exam was performed according to the departmental dose-optimization program which includes automated exposure control, adjustment of the mA and/or kV according to patient size and/or use of iterative reconstruction technique. COMPARISON:  Chest CT 12/28/2022 and 08/05/2020.  PET-CT 05/05/2020. FINDINGS: Cardiovascular: Atherosclerosis of the aorta, great vessels and coronary arteries. There are calcifications of the aortic valve. The heart size is normal. There is no pericardial effusion. Mediastinum/Nodes: There are no enlarged mediastinal, hilar or axillary lymph nodes.Hilar assessment is limited by the lack of intravenous contrast, although the hilar contours appear unchanged. The thyroid gland, trachea and esophagus demonstrate no significant findings. Lungs/Pleura: No pleural effusion or pneumothorax. Mild centrilobular  emphysema with mild diffuse central airway thickening. Spiculated nodule medially in the right upper lobe demonstrates minimal central cavitation and measures 2.4 x 1.6 cm on image 45/4 (2.1 cm maximally on most recent study). The spiculated left upper lobe nodule along the major fissure measures 1.9 x 1.3 cm on image 79/4 (similar to recent prior study). This nodule may cross the fissure. A small subpleural nodule in the right lower lobe measuring 5 mm on image 87/4 is unchanged. No other suspicious pulmonary nodules. Upper abdomen: The visualized upper abdomen appears stable without suspicious findings. Musculoskeletal/Chest wall: There is no chest wall mass or suspicious osseous finding. Mild spondylosis. IMPRESSION: 1. Imaging for bronchoscopy planning and guidance. 2. Spiculated right upper lobe and left upper lobe pulmonary nodules are similar to recent prior study, although have mildly increased in size from the most recent study. These remain suspicious for bronchogenic carcinoma. 3. No evidence of thoracic adenopathy or pleural effusion. 4. Aortic Atherosclerosis (ICD10-I70.0) and Emphysema (ICD10-J43.9). Electronically Signed   By: Richardean Sale M.D.   On: 03/06/2023 08:06    ASSESSMENT: This is a very pleasant 77 years old African-American male with stage Ia (T1b, N0, M0) non-small cell lung cancer, squamous cell carcinoma presented with left upper lobe lung nodule.  The patient also  has suspicious stage Ia in the right upper lobe but the biopsy showed atypical cells.  This was diagnosed in March 2024.   PLAN: I had a lengthy discussion with the patient today about his current disease stage, prognosis and treatment options. The patient has a lot of comorbidities and he also current smoker with COPD.  I doubt he will be a surgical candidate for resection of bilateral pulmonary nodules. I recommended for him to keep the appointment for the PET scan and MRI of the brain next week as planned. If he  has no evidence of metastatic disease on the upcoming imaging studies, he may benefit from SBRT to the bilateral pulmonary nodules by radiation oncology. I will arrange for him a follow-up appointment with me in 4 months for evaluation and repeat imaging studies for close monitoring of his disease after the radiotherapy. I strongly encouraged the patient to quit smoking. He was advised to call immediately if he has any other concerning symptoms in the interval. The patient voices understanding of current disease status and treatment options and is in agreement with the current care plan.  All questions were answered. The patient knows to call the clinic with any problems, questions or concerns. We can certainly see the patient much sooner if necessary.  Thank you so much for allowing me to participate in the care of Jeremy Hall. I will continue to follow up the patient with you and assist in his care.  The total time spent in the appointment was 60 minutes.  Disclaimer: This note was dictated with voice recognition software. Similar sounding words can inadvertently be transcribed and may not be corrected upon review.   Eilleen Kempf March 22, 2023, 1:52 PM

## 2023-03-22 NOTE — Addendum Note (Signed)
Addended by: Curt Bears on: 03/22/2023 02:56 PM   Modules accepted: Orders

## 2023-03-23 ENCOUNTER — Telehealth: Payer: Self-pay | Admitting: Radiation Oncology

## 2023-03-23 NOTE — Telephone Encounter (Signed)
Left message for patient to call back to schedule consult per 3/27 referral.

## 2023-03-27 ENCOUNTER — Ambulatory Visit (HOSPITAL_COMMUNITY): Payer: Medicare Other

## 2023-03-27 ENCOUNTER — Ambulatory Visit (HOSPITAL_COMMUNITY): Admission: RE | Admit: 2023-03-27 | Payer: Medicare Other | Source: Ambulatory Visit

## 2023-04-01 ENCOUNTER — Ambulatory Visit (HOSPITAL_COMMUNITY): Admission: RE | Admit: 2023-04-01 | Payer: Medicare Other | Source: Ambulatory Visit

## 2023-04-03 ENCOUNTER — Telehealth: Payer: Self-pay | Admitting: Radiation Oncology

## 2023-04-03 ENCOUNTER — Ambulatory Visit
Admission: RE | Admit: 2023-04-03 | Discharge: 2023-04-03 | Disposition: A | Discharge status: Home / Self Care | Payer: Medicare Other | Source: Ambulatory Visit | Attending: Radiation Oncology | Admitting: Radiation Oncology

## 2023-04-03 NOTE — Progress Notes (Signed)
Patient states that's he was not aware of the appointment. Notified register to reschedule the appointment. Will notify Dr. Roselind Messier.

## 2023-04-03 NOTE — Telephone Encounter (Signed)
Called patient to reschedule today's missed appointment. Patient is aware of changes.

## 2023-04-08 ENCOUNTER — Ambulatory Visit (HOSPITAL_COMMUNITY): Payer: Medicare Other | Attending: Emergency Medicine

## 2023-04-12 DIAGNOSIS — E1165 Type 2 diabetes mellitus with hyperglycemia: Secondary | ICD-10-CM | POA: Diagnosis not present

## 2023-04-12 DIAGNOSIS — Z0000 Encounter for general adult medical examination without abnormal findings: Secondary | ICD-10-CM | POA: Diagnosis not present

## 2023-04-12 DIAGNOSIS — F17200 Nicotine dependence, unspecified, uncomplicated: Secondary | ICD-10-CM | POA: Diagnosis not present

## 2023-04-12 DIAGNOSIS — Z23 Encounter for immunization: Secondary | ICD-10-CM | POA: Diagnosis not present

## 2023-04-12 DIAGNOSIS — J439 Emphysema, unspecified: Secondary | ICD-10-CM | POA: Diagnosis not present

## 2023-04-12 DIAGNOSIS — E559 Vitamin D deficiency, unspecified: Secondary | ICD-10-CM | POA: Diagnosis not present

## 2023-04-12 DIAGNOSIS — E1142 Type 2 diabetes mellitus with diabetic polyneuropathy: Secondary | ICD-10-CM | POA: Diagnosis not present

## 2023-04-12 DIAGNOSIS — E7800 Pure hypercholesterolemia, unspecified: Secondary | ICD-10-CM | POA: Diagnosis not present

## 2023-04-12 DIAGNOSIS — E1136 Type 2 diabetes mellitus with diabetic cataract: Secondary | ICD-10-CM | POA: Diagnosis not present

## 2023-04-12 DIAGNOSIS — I700 Atherosclerosis of aorta: Secondary | ICD-10-CM | POA: Diagnosis not present

## 2023-04-12 DIAGNOSIS — J449 Chronic obstructive pulmonary disease, unspecified: Secondary | ICD-10-CM | POA: Diagnosis not present

## 2023-04-12 DIAGNOSIS — E1151 Type 2 diabetes mellitus with diabetic peripheral angiopathy without gangrene: Secondary | ICD-10-CM | POA: Diagnosis not present

## 2023-04-12 DIAGNOSIS — I10 Essential (primary) hypertension: Secondary | ICD-10-CM | POA: Diagnosis not present

## 2023-04-13 ENCOUNTER — Telehealth: Payer: Self-pay

## 2023-04-13 ENCOUNTER — Telehealth: Payer: Self-pay | Admitting: Radiation Oncology

## 2023-04-13 ENCOUNTER — Ambulatory Visit
Admission: RE | Admit: 2023-04-13 | Discharge: 2023-04-13 | Disposition: A | Discharge status: Home / Self Care | Payer: Medicare Other | Source: Ambulatory Visit | Attending: Radiation Oncology | Admitting: Radiation Oncology

## 2023-04-13 ENCOUNTER — Ambulatory Visit: Payer: Medicare Other

## 2023-04-13 NOTE — Telephone Encounter (Signed)
Noticed patient was not checked in for the 12:30 nurse elval portion of his consult with Dr. Roselind Messier today. Called and spoke with patient who stated he had forgotten about the appointment since he just saw his PCP yesterday. Asked patient if he would be able to get here by 1-1:30 to still see Dr. Roselind Messier, but he stated that he would not be able to since his truck was currently in the shop. Informed him that someone from our scheduling department would be reaching out to get get appointment rescheduled. He verbalized understanding and stated he would try to set up transportation through his insurance once he had the new appointment date/time  Above information relayed to scheduling, Dr. Roselind Messier, and lung navigator.

## 2023-04-13 NOTE — Telephone Encounter (Signed)
Called patient to reschedule today's missed appointment. Patient is aware of changes. Mailed reminder.

## 2023-04-13 NOTE — Progress Notes (Signed)
NN received [place] message from Rad Onc team asking that the pt to make sure he has transportation arranged for upcoming appts, as he has missed several d/t forgetting the appt or his car was in the repair shop.  NN contacted the pt twice. Left VM on his mobile number, however was able to reach him at his Home number. Pt confirms that he has missed some appts because he doesn't have access to a car. NN states that the transportation coordinator, Ephriam Knuckles, will be contacted to assist him with his transportation needs.  NN also told the patient that he needs to get his brain MRI scheduled. NN will arrange this. Pt verbalized understanding of information provided NN scheduled pt's brain MRI for 4/30 @ 10:00. NN attempted to reach the patient at his home number to notify him of his appt, there was no answer and VM box was full. NN tried mobile number again, there was no answer. LVM with appt date and time.

## 2023-04-17 ENCOUNTER — Encounter (HOSPITAL_COMMUNITY)
Admission: RE | Admit: 2023-04-17 | Discharge: 2023-04-17 | Disposition: A | Discharge status: Home / Self Care | Payer: Medicare Other | Source: Ambulatory Visit | Attending: Emergency Medicine | Admitting: Emergency Medicine

## 2023-04-17 ENCOUNTER — Inpatient Hospital Stay: Payer: Medicare Other | Attending: Internal Medicine

## 2023-04-17 DIAGNOSIS — C3490 Malignant neoplasm of unspecified part of unspecified bronchus or lung: Secondary | ICD-10-CM

## 2023-04-17 LAB — GLUCOSE, CAPILLARY: Glucose-Capillary: 114 mg/dL — ABNORMAL HIGH (ref 70–99)

## 2023-04-17 MED ORDER — FLUDEOXYGLUCOSE F - 18 (FDG) INJECTION
12.0000 | Freq: Once | INTRAVENOUS | Status: AC | PRN
  Administered 2023-04-17: 12.23 via INTRAVENOUS

## 2023-04-17 NOTE — Progress Notes (Signed)
Thoracic Location of Tumor / Histology:  Bilateral lung nodules  Left upper lobe lung nodule: Stage Ia (T1b, N0, M0) non-small cell lung cancer, squamous cell carcinoma  Right upper lobe: suspicious stage Ia, but biopsy showed atypical cells   Patient presented with symptoms of: from Dr. Kavin Leech 02/17/23 office note: "Bilateral upper lobe pulmonary nodules that have increased in size going back to prior scan from 07/2020 "  PET 04/17/2023: Right upper lobe mass and left upper lobe pulmonary nodule, as above, compatible with metachronous primary bronchogenic neoplasms.  No evidence of metastatic disease.  Stable right parotid lesion, compatible with benign or malignant parotid neoplasm such as Warthin's tumor.    CT Super D Chest w/o Contrast 03/06/2023 --IMPRESSION: Imaging for bronchoscopy planning and guidance. Spiculated right upper lobe and left upper lobe pulmonary nodules are similar to recent prior study, although have mildly increased in size from the most recent study. These remain suspicious for bronchogenic carcinoma. No evidence of thoracic adenopathy or pleural effusion. Aortic Atherosclerosis (ICD10-I70.0) and Emphysema (ICD10-J43.9).  Biopsies revealed:  03/06/2023 A. LUNG, LUL, FINE NEEDLE ASPIRATION:  - Malignant cells present, consistent with squamous cell carcinoma, see comment  B. LUNG, LUL, BRUSHING:  - Atypical cells present  C. LUNG, RUL, FINE NEEDLE ASPIRATION:  - Atypical cells present  D. LUNG, RUL, BRUSHING:  - Atypical cells present  E. LUNG, RUL, LAVAGE:  - No malignant cells identified  COMMENT:  A. Immunostain meals stains show that the tumor cells are positive for p63 and CK5/6 while they are negative for TTF-1 synaptophysin and CD56, consistent with above interpretation.   Tobacco/Marijuana/Snuff/ETOH use: Patient is a current, every day smoker (~1/5 pack/day for the past 35 years).  He reports he is smoking about 8-10 cigarettes per  day.  Past/Anticipated interventions by cardiothoracic surgery, if any:  03/06/2023 --Dr. Levy Pupa  Video Bronchoscopy with Robotic Assisted Bronchoscopic Navigation   Past/Anticipated interventions by medical oncology, if any:  03/37/2024 --Dr. Si Gaul PLAN:  I had a lengthy discussion with the patient today about his current disease stage, prognosis and treatment options. The patient has a lot of comorbidities and he also current smoker with COPD.   I doubt he will be a surgical candidate for resection of bilateral pulmonary nodules. I recommended for him to keep the appointment for the PET scan and MRI of the brain next week as planned. If he has no evidence of metastatic disease on the upcoming imaging studies, he may benefit from SBRT to the bilateral pulmonary nodules by radiation oncology. I will arrange for him a follow-up appointment with me in 4 months for evaluation and repeat imaging studies for close monitoring of his disease after the radiotherapy. I strongly encouraged the patient to quit smoking. He was advised to call immediately if he has any other concerning symptoms in the interval. The patient voices understanding of current disease status and treatment options and is in agreement with the current care plan.  Signs/Symptoms Weight changes, if any: Weight is stable. Respiratory complaints, if any: He reports SOB when he does a lot of walking. Hemoptysis, if any: He reports very little coughing.  Denies hemoptysis. Pain issues, if any: No   SAFETY ISSUES: Prior radiation? No Pacemaker/ICD?  No Possible current pregnancy? N/A Is the patient on methotrexate? No  Current Complaints / other details:

## 2023-04-18 ENCOUNTER — Telehealth: Payer: Self-pay | Admitting: Emergency Medicine

## 2023-04-18 NOTE — Telephone Encounter (Signed)
Patient states Stiolto is too expensive at pharmacy. Pharmacy is [facility] [address]. Patient phone number is [phone].

## 2023-04-18 NOTE — Telephone Encounter (Signed)
Called and spoke with pt who states the Stiolto was going to cost him around $400.  Routing to pharmacy team to see which inhalers are covered by pt's insurance and then will send this info to Dr. Delton Coombes.

## 2023-04-20 ENCOUNTER — Ambulatory Visit (HOSPITAL_COMMUNITY): Payer: Medicare Other

## 2023-04-20 ENCOUNTER — Other Ambulatory Visit (HOSPITAL_COMMUNITY): Payer: Self-pay

## 2023-04-20 NOTE — Telephone Encounter (Signed)
Test claims for LABA+LAMA result in the following:   Stiolto: $47.00 Anoro: Requires PA Bevespi: $47.00  Also shows a filled and paid claim from 04-13-2023 for the [facility]

## 2023-04-22 NOTE — Progress Notes (Signed)
Radiation Oncology         [phone] ________________________________  Initial Outpatient Consultation  Name: Jeremy Hall MRN: [id number]  Date: 04/24/2023  DOB: 1946-08-25  [id number], Sherie Don, MD  Si Gaul, MD   REFERRING PHYSICIAN: Si Gaul, MD  DIAGNOSIS: There were no encounter diagnoses.  Likely simultaneous primary lung cancers: Squamous cell carcinoma of the LUL, and atypical cells of the RUL  HISTORY OF PRESENT ILLNESS::Jeremy Hall is a 77 y.o. male who is accompanied by ***. he is seen as a courtesy of Dr. Arbutus Ped for an opinion concerning radiation therapy as part of management for his recently diagnosed bilateral lung cancers.  The patient is a current smoker with an extensive smoking history (1 ppd for 57 years) and has participated in the lung cancer screening program for quite some time. Upon record review, the patient had a screening chest CT performed on 04/09/2020 which demonstrated multiple stable pulmonary nodules throughout the lungs bilaterally. However, a slow growing micro-lobulated and extensively spiculated nodule in association with the left major fissure was noted, measuring 1.3 cm in mean diameter. This was first depicted on a prior lung cancer screening CT from 2020.   PET scan for further evaluation of the slow growing area on 05/05/2020 showed a 1.1 cm irregular left lung nodule along the fissure compatible with primary bronchogenic neoplasm. PET otherwise showed no findings suspicious for metastatic disease elsewhere in the body.   He ultimately continued under observation with follow-up imaging. Follow-up super-D chest CT on 08/05/20 showed interval stability of the spiculated nodule along the left major fissure. Additional stable areas of peribronchovascular nodularity were appreciated bilaterally. Other findings of potential clinical significance noted on CT included evidence of liver cirrhosis, a right adrenal mass  (consistent with a lipid poor adenoma), and findings consistent with PAH.   Screening chest CT on 12/28/22 showed a spiculated RUL nodule measuring 2.2 cm, and an increase in size of the spiculated nodule of the left lower lobe measuring 1.9 cm. These findings raised high concern for primary lung malignancy (likely metachronous multiple primary malignancies).    Accordingly, the patient referred to pulmonary medicine and opted to proceed with bronchoscopy for biopsies on 03/06/23 under the care of Dr. Delton Coombes. FNA of the LUL collected showed findings consistent with squamous cell carcinoma. FNA and brushings of the RUL showed atypical cells present. (Cytology from RUL lavage showed no evidence of malignancy).  (Chest CT performed for surgical planning on 03/11 showed a very mild interval increase in size of the RUL and LUL nodules when compared to the prior CT from 01/03).    Subsequently, the patient was referred to Dr. Arbutus Ped on 03/22/23 to discuss treatment options. During this visit, the patient endorsed left knee pain, shortness of breath with exertion, and cough productive of clear sputum. He denied any other symptoms or concerns. In terms of treatment, Dr. Arbutus Ped recommends radiation therapy to both the left and right lung nodules which we will discuss in detail today. The patient is scheduled for a brain MRI and PET scan to complete his staging work-up and to rule out any metastatic disease.   Since the patient continues to smoke, it is unlikely that he will be a candidate for surgery.   Pertinent imaging performed thus far includes a PET scan on 04/17/23 which redemonstrated the right upper lobe mass and left upper lobe pulmonary nodule, compatible with metachronous primary bronchogenic neoplasms. The RUL mass was appreciated to measure 3.2 x 1.5  cm and with an SUV max of 12.5. The LUL nodule measured approximately 1.4 x 1.9 cm on this study and also displayed an SUV max of 12.5. PET otherwise  showed no evidence of metastatic disease, and stability of the previously seen right parotid lesion (compatible with a benign or malignant neoplasm such as a Warthin's tumor), and stability of the benign right adrenal adenoma.   ***  PREVIOUS RADIATION THERAPY: No  PAST MEDICAL HISTORY:  Past Medical History:  Diagnosis Date   Arthritis    osteo   COPD (chronic obstructive pulmonary disease) (HCC)    emphysema - per notes from Dr. Kirby Funk. Pt denies this.   Diabetes mellitus without complication (HCC)    diet controlled   Dyspnea    GERD (gastroesophageal reflux disease)    High cholesterol    Hypertension    Hypertensive retinopathy    Peripheral vascular disease (HCC)    Pulmonary nodule     PAST SURGICAL HISTORY: Past Surgical History:  Procedure Laterality Date   APPENDECTOMY     BRONCHIAL BIOPSY  03/06/2023   Procedure: BRONCHIAL BIOPSIES;  Surgeon: Leslye Peer, MD;  Location: [facility] ENDOSCOPY;  Service: Pulmonary;;   BRONCHIAL BRUSHINGS  03/06/2023   Procedure: BRONCHIAL BRUSHINGS;  Surgeon: Leslye Peer, MD;  Location: [facility] ENDOSCOPY;  Service: Pulmonary;;   BRONCHIAL NEEDLE ASPIRATION BIOPSY  03/06/2023   Procedure: BRONCHIAL NEEDLE ASPIRATION BIOPSIES;  Surgeon: Leslye Peer, MD;  Location: [facility] ENDOSCOPY;  Service: Pulmonary;;   BRONCHIAL WASHINGS  03/06/2023   Procedure: BRONCHIAL WASHINGS;  Surgeon: Leslye Peer, MD;  Location: [facility] ENDOSCOPY;  Service: Pulmonary;;   CHOLECYSTECTOMY     FIDUCIAL MARKER PLACEMENT  03/06/2023   Procedure: FIDUCIAL MARKER PLACEMENT;  Surgeon: Leslye Peer, MD;  Location: [facility] ENDOSCOPY;  Service: Pulmonary;;    FAMILY HISTORY:  Family History  Problem Relation Age of Onset   Cancer Mother     SOCIAL HISTORY:  Social History   Tobacco Use   Smoking status: Every Day    Packs/day: 0.50    Years: 35.00    Additional pack years: 0.00    Total pack years: 17.50    Types: Cigarettes   Smokeless tobacco: Never   Tobacco  comments:    1-2 cig/day as of 08/18/2020  Substance Use Topics   Alcohol use: Not Currently   Drug use: Never    ALLERGIES: No Known Allergies  MEDICATIONS:  Current Outpatient Medications  Medication Sig Dispense Refill   acetaminophen (TYLENOL) 500 MG tablet Take 500 mg by mouth every 8 (eight) hours as needed for mild pain or moderate pain.     albuterol (VENTOLIN HFA) 108 (90 Base) MCG/ACT inhaler Inhale 2 puffs into the lungs every 6 (six) hours as needed for wheezing or shortness of breath.     aspirin EC 81 MG tablet Take 81 mg by mouth daily.     famotidine (PEPCID) 10 MG tablet Take 10 mg by mouth 2 (two) times daily as needed for heartburn or indigestion.     Fluticasone-Umeclidin-Vilant (TRELEGY ELLIPTA) 100-62.5-25 MCG/INH AEPB Inhale 1 puff into the lungs daily. (Patient not taking: Reported on 08/18/2020) 14 each 0   pravastatin (PRAVACHOL) 40 MG tablet Take 40 mg by mouth daily.     Tiotropium Bromide-Olodaterol (STIOLTO RESPIMAT) 2.5-2.5 MCG/ACT AERS Inhale 1 each into the lungs daily.     valsartan-hydrochlorothiazide (DIOVAN-HCT) 160-25 MG tablet Take 1 tablet by mouth daily.     No current  facility-administered medications for this encounter.    REVIEW OF SYSTEMS:  A 10+ POINT REVIEW OF SYSTEMS WAS OBTAINED including neurology, dermatology, psychiatry, cardiac, respiratory, lymph, extremities, GI, GU, musculoskeletal, constitutional, reproductive, HEENT. ***   PHYSICAL EXAM:  vitals were not taken for this visit.   General: Alert and oriented, in no acute distress HEENT: Head is normocephalic. Extraocular movements are intact. Oropharynx is clear. Neck: Neck is supple, no palpable cervical or supraclavicular lymphadenopathy. Heart: Regular in rate and rhythm with no murmurs, rubs, or gallops. Chest: Clear to auscultation bilaterally, with no rhonchi, wheezes, or rales. Abdomen: Soft, nontender, nondistended, with no rigidity or guarding. Extremities: No cyanosis or  edema. Lymphatics: see Neck Exam Skin: No concerning lesions. Musculoskeletal: symmetric strength and muscle tone throughout. Neurologic: Cranial nerves II through XII are grossly intact. No obvious focalities. Speech is fluent. Coordination is intact. Psychiatric: Judgment and insight are intact. Affect is appropriate. ***  ECOG = ***  0 - Asymptomatic (Fully active, able to carry on all predisease activities without restriction)  1 - Symptomatic but completely ambulatory (Restricted in physically strenuous activity but ambulatory and able to carry out work of a light or sedentary nature. For example, light housework, office work)  2 - Symptomatic, <50% in bed during the day (Ambulatory and capable of all self care but unable to carry out any work activities. Up and about more than 50% of waking hours)  3 - Symptomatic, >50% in bed, but not bedbound (Capable of only limited self-care, confined to bed or chair 50% or more of waking hours)  4 - Bedbound (Completely disabled. Cannot carry on any self-care. Totally confined to bed or chair)  5 - Death   Santiago Glad MM, Creech RH, Tormey DC, et al. [phone]). "Toxicity and response criteria of the [facility]". Am. Evlyn Clines. Oncol. 5 (6): 649-55  LABORATORY DATA:  Lab Results  Component Value Date   WBC 9.9 03/22/2023   HGB 16.0 03/22/2023   HCT 48.3 03/22/2023   MCV 91.0 03/22/2023   PLT 362 03/22/2023   NEUTROABS 6.2 03/22/2023   Lab Results  Component Value Date   NA 138 03/22/2023   K 4.4 03/22/2023   CL 100 03/22/2023   CO2 31 03/22/2023   GLUCOSE 122 (H) 03/22/2023   BUN 19 03/22/2023   CREATININE 1.13 03/22/2023   CALCIUM 9.4 03/22/2023      RADIOGRAPHY: NM PET Image Initial (PI) Skull Base To Thigh  Result Date: 04/19/2023 CLINICAL DATA:  Subsequent treatment strategy for non-small cell lung cancer. EXAM: NUCLEAR MEDICINE PET SKULL BASE TO THIGH TECHNIQUE: 12.2 mCi F-18 FDG was injected intravenously.  Full-ring PET imaging was performed from the skull base to thigh after the radiotracer. CT data was obtained and used for attenuation correction and anatomic localization. Fasting blood glucose: 114 mg/dl COMPARISON:  CT chest dated 03/06/2023.  PET-CT dated 05/05/2020. FINDINGS: Mediastinal blood pool activity: SUV max 2.9 Liver activity: SUV max NA NECK: No hypermetabolic cervical lymphadenopathy. Stable 2.1 cm lesion in the right posterior parotid (series 4/image 26), max SUV 7.3, compatible with benign or malignant parotid neoplasm such as Warthin's tumor. Incidental CT findings: None. CHEST: 3.1 x 1.6 cm spiculated medial right upper lobe mass (series 7/image 20), max SUV 12.5. This measures 3.2 x 1.5 cm when remeasured in a similar fashion, unchanged. 1.3 x 2.1 cm irregular/spiculated posterior left upper lobe nodule (series 7/image 32), max SUV 12.5. This measures 1.4 x 1.9 cm when remeasured in a  similar fashion, possibly minimally progressive. These both suspicious for metachronous primary bronchogenic neoplasms. No hypermetabolic thoracic lymphadenopathy. Incidental CT findings: Mild atherosclerotic calcifications of the aortic arch. Mild coronary atherosclerosis of the LAD. ABDOMEN/PELVIS: No abnormal hypermetabolism in the liver, spleen, pancreas, or left adrenal gland. Stable 3.1 cm right adrenal mass (series 4/image 117), max SUV 7.2, unchanged from prior PET. This remains compatible with a lipid poor adenoma. No hypermetabolic abdominopelvic lymphadenopathy. Incidental CT findings: Atherosclerotic calcifications of the abdominal aorta and branch vessels. Sigmoid diverticulosis, without evidence of diverticulitis. Tiny fat containing right inguinal hernia. SKELETON: No focal hypermetabolic activity to suggest skeletal metastasis. Incidental CT findings: Degenerative changes of the lumbar spine. IMPRESSION: Right upper lobe mass and left upper lobe pulmonary nodule, as above, compatible with  metachronous primary bronchogenic neoplasms. No evidence of metastatic disease. Stable right parotid lesion, compatible with benign or malignant parotid neoplasm such as Warthin's tumor. Stable right adrenal adenoma, benign. Electronically Signed   By: Charline Bills M.D.   On: 04/19/2023 04:22      IMPRESSION: Likely simultaneous primary lung cancers: Squamous cell carcinoma of the LUL, and atypical cells of the RUL  ***  Today, I talked to the patient and family about the findings and work-up thus far.  We discussed the natural history of *** and general treatment, highlighting the role of radiotherapy in the management.  We discussed the available radiation techniques, and focused on the details of logistics and delivery.  We reviewed the anticipated acute and late sequelae associated with radiation in this setting.  The patient was encouraged to ask questions that I answered to the best of my ability. *** A patient consent form was discussed and signed.  We retained a copy for our records.  The patient would like to proceed with radiation and will be scheduled for CT simulation.  PLAN: ***    *** minutes of total time was spent for this patient encounter, including preparation, face-to-face counseling with the patient and coordination of care, physical exam, and documentation of the encounter.   ------------------------------------------------  Billie Lade, PhD, MD  This document serves as a record of services personally performed by Antony Blackbird, MD. It was created on his behalf by Neena Rhymes, a trained medical scribe. The creation of this record is based on the scribe's personal observations and the provider's statements to them. This document has been checked and approved by the attending provider.

## 2023-04-24 ENCOUNTER — Telehealth: Payer: Self-pay | Admitting: Radiation Oncology

## 2023-04-24 ENCOUNTER — Encounter: Payer: Self-pay | Admitting: Radiation Oncology

## 2023-04-24 ENCOUNTER — Inpatient Hospital Stay: Payer: Medicare Other

## 2023-04-24 ENCOUNTER — Ambulatory Visit
Admission: RE | Admit: 2023-04-24 | Discharge: 2023-04-24 | Disposition: A | Discharge status: Home / Self Care | Payer: Medicare Other | Source: Ambulatory Visit | Attending: Radiation Oncology | Admitting: Radiation Oncology

## 2023-04-24 VITALS — BP 148/77 | HR 66 | Temp 97.2°F | Resp 18 | Ht 72.0 in | Wt 239.0 lb

## 2023-04-24 DIAGNOSIS — Z7982 Long term (current) use of aspirin: Secondary | ICD-10-CM | POA: Insufficient documentation

## 2023-04-24 DIAGNOSIS — C3412 Malignant neoplasm of upper lobe, left bronchus or lung: Secondary | ICD-10-CM

## 2023-04-24 DIAGNOSIS — Z79899 Other long term (current) drug therapy: Secondary | ICD-10-CM | POA: Insufficient documentation

## 2023-04-24 DIAGNOSIS — R058 Other specified cough: Secondary | ICD-10-CM | POA: Diagnosis not present

## 2023-04-24 DIAGNOSIS — J439 Emphysema, unspecified: Secondary | ICD-10-CM | POA: Insufficient documentation

## 2023-04-24 DIAGNOSIS — K219 Gastro-esophageal reflux disease without esophagitis: Secondary | ICD-10-CM | POA: Insufficient documentation

## 2023-04-24 DIAGNOSIS — M25562 Pain in left knee: Secondary | ICD-10-CM | POA: Insufficient documentation

## 2023-04-24 DIAGNOSIS — C3411 Malignant neoplasm of upper lobe, right bronchus or lung: Secondary | ICD-10-CM | POA: Diagnosis not present

## 2023-04-24 DIAGNOSIS — I2510 Atherosclerotic heart disease of native coronary artery without angina pectoris: Secondary | ICD-10-CM | POA: Insufficient documentation

## 2023-04-24 DIAGNOSIS — E1151 Type 2 diabetes mellitus with diabetic peripheral angiopathy without gangrene: Secondary | ICD-10-CM | POA: Insufficient documentation

## 2023-04-24 DIAGNOSIS — E7800 Pure hypercholesterolemia, unspecified: Secondary | ICD-10-CM | POA: Insufficient documentation

## 2023-04-24 DIAGNOSIS — D3501 Benign neoplasm of right adrenal gland: Secondary | ICD-10-CM | POA: Insufficient documentation

## 2023-04-24 DIAGNOSIS — I10 Essential (primary) hypertension: Secondary | ICD-10-CM | POA: Insufficient documentation

## 2023-04-24 DIAGNOSIS — F17210 Nicotine dependence, cigarettes, uncomplicated: Secondary | ICD-10-CM | POA: Diagnosis not present

## 2023-04-24 NOTE — Telephone Encounter (Signed)
Returned pt's call to verify appt time for consultation.

## 2023-04-25 ENCOUNTER — Ambulatory Visit (HOSPITAL_COMMUNITY): Admission: RE | Admit: 2023-04-25 | Payer: Medicare Other | Source: Ambulatory Visit

## 2023-05-02 ENCOUNTER — Ambulatory Visit (HOSPITAL_COMMUNITY): Admission: RE | Admit: 2023-05-02 | Payer: Medicare Other | Source: Ambulatory Visit

## 2023-05-02 ENCOUNTER — Other Ambulatory Visit: Payer: Self-pay

## 2023-05-02 ENCOUNTER — Ambulatory Visit
Admission: RE | Admit: 2023-05-02 | Discharge: 2023-05-02 | Disposition: A | Discharge status: Home / Self Care | Payer: Medicare Other | Source: Ambulatory Visit | Attending: Radiation Oncology | Admitting: Radiation Oncology

## 2023-05-02 ENCOUNTER — Inpatient Hospital Stay: Payer: Medicare Other | Attending: Internal Medicine

## 2023-05-02 DIAGNOSIS — Z510 Encounter for antineoplastic radiation therapy: Secondary | ICD-10-CM | POA: Diagnosis not present

## 2023-05-02 DIAGNOSIS — C3411 Malignant neoplasm of upper lobe, right bronchus or lung: Secondary | ICD-10-CM | POA: Insufficient documentation

## 2023-05-02 DIAGNOSIS — F17210 Nicotine dependence, cigarettes, uncomplicated: Secondary | ICD-10-CM | POA: Diagnosis not present

## 2023-05-02 DIAGNOSIS — C3412 Malignant neoplasm of upper lobe, left bronchus or lung: Secondary | ICD-10-CM | POA: Insufficient documentation

## 2023-05-15 DIAGNOSIS — F17210 Nicotine dependence, cigarettes, uncomplicated: Secondary | ICD-10-CM | POA: Diagnosis not present

## 2023-05-15 DIAGNOSIS — Z510 Encounter for antineoplastic radiation therapy: Secondary | ICD-10-CM | POA: Diagnosis not present

## 2023-05-15 DIAGNOSIS — C3411 Malignant neoplasm of upper lobe, right bronchus or lung: Secondary | ICD-10-CM | POA: Diagnosis not present

## 2023-05-15 DIAGNOSIS — C3412 Malignant neoplasm of upper lobe, left bronchus or lung: Secondary | ICD-10-CM | POA: Diagnosis not present

## 2023-05-16 ENCOUNTER — Ambulatory Visit: Payer: Medicare Other | Admitting: Radiation Oncology

## 2023-05-17 DIAGNOSIS — C3412 Malignant neoplasm of upper lobe, left bronchus or lung: Secondary | ICD-10-CM | POA: Diagnosis not present

## 2023-05-17 DIAGNOSIS — F17210 Nicotine dependence, cigarettes, uncomplicated: Secondary | ICD-10-CM | POA: Diagnosis not present

## 2023-05-17 DIAGNOSIS — C3411 Malignant neoplasm of upper lobe, right bronchus or lung: Secondary | ICD-10-CM | POA: Diagnosis not present

## 2023-05-17 DIAGNOSIS — Z510 Encounter for antineoplastic radiation therapy: Secondary | ICD-10-CM | POA: Diagnosis not present

## 2023-05-18 ENCOUNTER — Other Ambulatory Visit: Payer: Self-pay

## 2023-05-18 ENCOUNTER — Ambulatory Visit
Admission: RE | Admit: 2023-05-18 | Discharge: 2023-05-18 | Disposition: A | Discharge status: Home / Self Care | Payer: Medicare Other | Source: Ambulatory Visit | Attending: Radiation Oncology | Admitting: Radiation Oncology

## 2023-05-18 ENCOUNTER — Encounter: Payer: Medicare Other | Admitting: Radiation Oncology

## 2023-05-18 DIAGNOSIS — C3411 Malignant neoplasm of upper lobe, right bronchus or lung: Secondary | ICD-10-CM | POA: Diagnosis not present

## 2023-05-18 DIAGNOSIS — C3412 Malignant neoplasm of upper lobe, left bronchus or lung: Secondary | ICD-10-CM

## 2023-05-18 DIAGNOSIS — F17210 Nicotine dependence, cigarettes, uncomplicated: Secondary | ICD-10-CM | POA: Diagnosis not present

## 2023-05-18 DIAGNOSIS — Z510 Encounter for antineoplastic radiation therapy: Secondary | ICD-10-CM | POA: Diagnosis not present

## 2023-05-18 LAB — RAD ONC ARIA SESSION SUMMARY
Course Elapsed Days: 0
Plan Fractions Treated to Date: 1
Plan Fractions Treated to Date: 1
Plan Prescribed Dose Per Fraction: 18 Gy
Plan Prescribed Dose Per Fraction: 6 Gy
Plan Total Fractions Prescribed: 10
Plan Total Fractions Prescribed: 3
Plan Total Prescribed Dose: 54 Gy
Plan Total Prescribed Dose: 60 Gy
Reference Point Dosage Given to Date: 18 Gy
Reference Point Dosage Given to Date: 6 Gy
Reference Point Session Dosage Given: 18 Gy
Reference Point Session Dosage Given: 6 Gy
Session Number: 1

## 2023-05-19 ENCOUNTER — Other Ambulatory Visit: Payer: Self-pay

## 2023-05-19 ENCOUNTER — Ambulatory Visit
Admission: RE | Admit: 2023-05-19 | Discharge: 2023-05-19 | Disposition: A | Discharge status: Home / Self Care | Payer: Medicare Other | Source: Ambulatory Visit | Attending: Radiation Oncology | Admitting: Radiation Oncology

## 2023-05-19 DIAGNOSIS — C3411 Malignant neoplasm of upper lobe, right bronchus or lung: Secondary | ICD-10-CM | POA: Diagnosis not present

## 2023-05-19 DIAGNOSIS — C3412 Malignant neoplasm of upper lobe, left bronchus or lung: Secondary | ICD-10-CM | POA: Diagnosis not present

## 2023-05-19 DIAGNOSIS — Z510 Encounter for antineoplastic radiation therapy: Secondary | ICD-10-CM | POA: Diagnosis not present

## 2023-05-19 DIAGNOSIS — F17210 Nicotine dependence, cigarettes, uncomplicated: Secondary | ICD-10-CM | POA: Diagnosis not present

## 2023-05-19 LAB — RAD ONC ARIA SESSION SUMMARY
Course Elapsed Days: 1
Plan Fractions Treated to Date: 2
Plan Prescribed Dose Per Fraction: 6 Gy
Plan Total Fractions Prescribed: 10
Plan Total Prescribed Dose: 60 Gy
Reference Point Dosage Given to Date: 12 Gy
Reference Point Session Dosage Given: 6 Gy
Session Number: 2

## 2023-05-20 ENCOUNTER — Ambulatory Visit: Payer: Medicare Other | Admitting: Radiation Oncology

## 2023-05-22 ENCOUNTER — Encounter: Payer: Medicare Other | Admitting: Radiation Oncology

## 2023-05-22 ENCOUNTER — Encounter: Payer: Self-pay | Admitting: Radiation Oncology

## 2023-05-23 ENCOUNTER — Ambulatory Visit
Admission: RE | Admit: 2023-05-23 | Discharge: 2023-05-23 | Disposition: A | Discharge status: Home / Self Care | Payer: Medicare Other | Source: Ambulatory Visit | Attending: Radiation Oncology | Admitting: Radiation Oncology

## 2023-05-23 ENCOUNTER — Ambulatory Visit: Payer: Medicare Other | Admitting: Radiation Oncology

## 2023-05-23 ENCOUNTER — Other Ambulatory Visit: Payer: Self-pay

## 2023-05-23 DIAGNOSIS — Z510 Encounter for antineoplastic radiation therapy: Secondary | ICD-10-CM | POA: Diagnosis not present

## 2023-05-23 DIAGNOSIS — F17210 Nicotine dependence, cigarettes, uncomplicated: Secondary | ICD-10-CM | POA: Diagnosis not present

## 2023-05-23 DIAGNOSIS — R918 Other nonspecific abnormal finding of lung field: Secondary | ICD-10-CM

## 2023-05-23 DIAGNOSIS — C3411 Malignant neoplasm of upper lobe, right bronchus or lung: Secondary | ICD-10-CM | POA: Diagnosis not present

## 2023-05-23 DIAGNOSIS — C3412 Malignant neoplasm of upper lobe, left bronchus or lung: Secondary | ICD-10-CM | POA: Diagnosis not present

## 2023-05-23 LAB — RAD ONC ARIA SESSION SUMMARY
Course Elapsed Days: 5
Plan Fractions Treated to Date: 2
Plan Fractions Treated to Date: 3
Plan Prescribed Dose Per Fraction: 18 Gy
Plan Prescribed Dose Per Fraction: 6 Gy
Plan Total Fractions Prescribed: 10
Plan Total Fractions Prescribed: 3
Plan Total Prescribed Dose: 54 Gy
Plan Total Prescribed Dose: 60 Gy
Reference Point Dosage Given to Date: 18 Gy
Reference Point Dosage Given to Date: 36 Gy
Reference Point Session Dosage Given: 18 Gy
Reference Point Session Dosage Given: 6 Gy
Session Number: 3

## 2023-05-24 ENCOUNTER — Telehealth: Payer: Self-pay | Admitting: Radiation Oncology

## 2023-05-24 ENCOUNTER — Ambulatory Visit: Payer: Medicare Other

## 2023-05-24 ENCOUNTER — Encounter: Payer: Self-pay | Admitting: Radiation Oncology

## 2023-05-24 NOTE — Telephone Encounter (Signed)
Pt called to cancel today's appt due to knee pain limiting mobility. Message sent to [place] [facility] to advise of this.

## 2023-05-25 ENCOUNTER — Other Ambulatory Visit: Payer: Self-pay

## 2023-05-25 ENCOUNTER — Encounter: Payer: Self-pay | Admitting: Radiation Oncology

## 2023-05-25 ENCOUNTER — Ambulatory Visit
Admission: RE | Admit: 2023-05-25 | Discharge: 2023-05-25 | Disposition: A | Discharge status: Home / Self Care | Payer: Medicare Other | Source: Ambulatory Visit | Attending: Radiation Oncology | Admitting: Radiation Oncology

## 2023-05-25 ENCOUNTER — Inpatient Hospital Stay: Payer: Medicare Other

## 2023-05-25 DIAGNOSIS — C3412 Malignant neoplasm of upper lobe, left bronchus or lung: Secondary | ICD-10-CM

## 2023-05-25 DIAGNOSIS — Z510 Encounter for antineoplastic radiation therapy: Secondary | ICD-10-CM | POA: Diagnosis not present

## 2023-05-25 DIAGNOSIS — F17210 Nicotine dependence, cigarettes, uncomplicated: Secondary | ICD-10-CM | POA: Diagnosis not present

## 2023-05-25 DIAGNOSIS — C3411 Malignant neoplasm of upper lobe, right bronchus or lung: Secondary | ICD-10-CM | POA: Diagnosis not present

## 2023-05-25 LAB — RAD ONC ARIA SESSION SUMMARY
Course Elapsed Days: 7
Plan Fractions Treated to Date: 3
Plan Fractions Treated to Date: 4
Plan Prescribed Dose Per Fraction: 18 Gy
Plan Prescribed Dose Per Fraction: 6 Gy
Plan Total Fractions Prescribed: 10
Plan Total Fractions Prescribed: 3
Plan Total Prescribed Dose: 54 Gy
Plan Total Prescribed Dose: 60 Gy
Reference Point Dosage Given to Date: 24 Gy
Reference Point Dosage Given to Date: 54 Gy
Reference Point Session Dosage Given: 18 Gy
Reference Point Session Dosage Given: 6 Gy
Session Number: 4

## 2023-05-26 ENCOUNTER — Inpatient Hospital Stay: Payer: Medicare Other

## 2023-05-26 ENCOUNTER — Other Ambulatory Visit: Payer: Self-pay

## 2023-05-26 ENCOUNTER — Ambulatory Visit
Admission: RE | Admit: 2023-05-26 | Discharge: 2023-05-26 | Disposition: A | Discharge status: Home / Self Care | Payer: Medicare Other | Source: Ambulatory Visit | Attending: Radiation Oncology | Admitting: Radiation Oncology

## 2023-05-26 ENCOUNTER — Encounter: Payer: Self-pay | Admitting: Radiation Oncology

## 2023-05-26 DIAGNOSIS — F17210 Nicotine dependence, cigarettes, uncomplicated: Secondary | ICD-10-CM | POA: Diagnosis not present

## 2023-05-26 DIAGNOSIS — C3411 Malignant neoplasm of upper lobe, right bronchus or lung: Secondary | ICD-10-CM | POA: Diagnosis not present

## 2023-05-26 DIAGNOSIS — C3412 Malignant neoplasm of upper lobe, left bronchus or lung: Secondary | ICD-10-CM | POA: Diagnosis not present

## 2023-05-26 DIAGNOSIS — Z510 Encounter for antineoplastic radiation therapy: Secondary | ICD-10-CM | POA: Diagnosis not present

## 2023-05-26 LAB — RAD ONC ARIA SESSION SUMMARY
Course Elapsed Days: 8
Plan Fractions Treated to Date: 5
Plan Prescribed Dose Per Fraction: 6 Gy
Plan Total Fractions Prescribed: 10
Plan Total Prescribed Dose: 60 Gy
Reference Point Dosage Given to Date: 30 Gy
Reference Point Session Dosage Given: 6 Gy
Session Number: 5

## 2023-05-29 ENCOUNTER — Other Ambulatory Visit: Payer: Self-pay

## 2023-05-29 ENCOUNTER — Ambulatory Visit
Admission: RE | Admit: 2023-05-29 | Discharge: 2023-05-29 | Disposition: A | Discharge status: Home / Self Care | Payer: Medicare HMO | Source: Ambulatory Visit | Attending: Radiation Oncology | Admitting: Radiation Oncology

## 2023-05-29 ENCOUNTER — Inpatient Hospital Stay: Payer: Medicare HMO | Attending: Internal Medicine

## 2023-05-29 ENCOUNTER — Encounter: Payer: Self-pay | Admitting: Radiation Oncology

## 2023-05-29 DIAGNOSIS — C3411 Malignant neoplasm of upper lobe, right bronchus or lung: Secondary | ICD-10-CM | POA: Diagnosis not present

## 2023-05-29 DIAGNOSIS — F17210 Nicotine dependence, cigarettes, uncomplicated: Secondary | ICD-10-CM | POA: Diagnosis not present

## 2023-05-29 DIAGNOSIS — Z510 Encounter for antineoplastic radiation therapy: Secondary | ICD-10-CM | POA: Diagnosis not present

## 2023-05-29 DIAGNOSIS — C3412 Malignant neoplasm of upper lobe, left bronchus or lung: Secondary | ICD-10-CM | POA: Diagnosis not present

## 2023-05-29 LAB — RAD ONC ARIA SESSION SUMMARY
Course Elapsed Days: 11
Plan Fractions Treated to Date: 6
Plan Prescribed Dose Per Fraction: 6 Gy
Plan Total Fractions Prescribed: 10
Plan Total Prescribed Dose: 60 Gy
Reference Point Dosage Given to Date: 36 Gy
Reference Point Session Dosage Given: 6 Gy
Session Number: 6

## 2023-05-30 ENCOUNTER — Ambulatory Visit: Payer: Medicare HMO

## 2023-05-30 ENCOUNTER — Inpatient Hospital Stay: Payer: Medicare HMO

## 2023-05-30 ENCOUNTER — Encounter: Payer: Self-pay | Admitting: Radiation Oncology

## 2023-05-30 ENCOUNTER — Other Ambulatory Visit: Payer: Self-pay

## 2023-05-30 ENCOUNTER — Ambulatory Visit
Admission: RE | Admit: 2023-05-30 | Discharge: 2023-05-30 | Disposition: A | Discharge status: Home / Self Care | Payer: Medicare HMO | Source: Ambulatory Visit | Attending: Radiation Oncology | Admitting: Radiation Oncology

## 2023-05-30 DIAGNOSIS — C3411 Malignant neoplasm of upper lobe, right bronchus or lung: Secondary | ICD-10-CM | POA: Diagnosis not present

## 2023-05-30 DIAGNOSIS — Z510 Encounter for antineoplastic radiation therapy: Secondary | ICD-10-CM | POA: Diagnosis not present

## 2023-05-30 DIAGNOSIS — F17210 Nicotine dependence, cigarettes, uncomplicated: Secondary | ICD-10-CM | POA: Diagnosis not present

## 2023-05-30 DIAGNOSIS — C3412 Malignant neoplasm of upper lobe, left bronchus or lung: Secondary | ICD-10-CM | POA: Diagnosis not present

## 2023-05-30 LAB — RAD ONC ARIA SESSION SUMMARY
Course Elapsed Days: 12
Plan Fractions Treated to Date: 7
Plan Prescribed Dose Per Fraction: 6 Gy
Plan Total Fractions Prescribed: 10
Plan Total Prescribed Dose: 60 Gy
Reference Point Dosage Given to Date: 42 Gy
Reference Point Session Dosage Given: 6 Gy
Session Number: 7

## 2023-05-31 ENCOUNTER — Ambulatory Visit
Admission: RE | Admit: 2023-05-31 | Discharge: 2023-05-31 | Disposition: A | Discharge status: Home / Self Care | Payer: Medicare HMO | Source: Ambulatory Visit | Attending: Radiation Oncology | Admitting: Radiation Oncology

## 2023-05-31 ENCOUNTER — Other Ambulatory Visit: Payer: Self-pay

## 2023-05-31 ENCOUNTER — Encounter: Payer: Self-pay | Admitting: Radiation Oncology

## 2023-05-31 DIAGNOSIS — C3412 Malignant neoplasm of upper lobe, left bronchus or lung: Secondary | ICD-10-CM | POA: Diagnosis not present

## 2023-05-31 DIAGNOSIS — F17210 Nicotine dependence, cigarettes, uncomplicated: Secondary | ICD-10-CM | POA: Diagnosis not present

## 2023-05-31 DIAGNOSIS — C3411 Malignant neoplasm of upper lobe, right bronchus or lung: Secondary | ICD-10-CM | POA: Diagnosis not present

## 2023-05-31 DIAGNOSIS — Z510 Encounter for antineoplastic radiation therapy: Secondary | ICD-10-CM | POA: Diagnosis not present

## 2023-05-31 LAB — RAD ONC ARIA SESSION SUMMARY
Course Elapsed Days: 13
Plan Fractions Treated to Date: 8
Plan Prescribed Dose Per Fraction: 6 Gy
Plan Total Fractions Prescribed: 10
Plan Total Prescribed Dose: 60 Gy
Reference Point Dosage Given to Date: 48 Gy
Reference Point Session Dosage Given: 6 Gy
Session Number: 8

## 2023-06-01 ENCOUNTER — Other Ambulatory Visit: Payer: Self-pay

## 2023-06-01 ENCOUNTER — Ambulatory Visit
Admission: RE | Admit: 2023-06-01 | Discharge: 2023-06-01 | Disposition: A | Discharge status: Home / Self Care | Payer: Medicare HMO | Source: Ambulatory Visit | Attending: Radiation Oncology | Admitting: Radiation Oncology

## 2023-06-01 ENCOUNTER — Inpatient Hospital Stay: Payer: Medicare HMO

## 2023-06-01 DIAGNOSIS — C3411 Malignant neoplasm of upper lobe, right bronchus or lung: Secondary | ICD-10-CM | POA: Diagnosis not present

## 2023-06-01 DIAGNOSIS — C3412 Malignant neoplasm of upper lobe, left bronchus or lung: Secondary | ICD-10-CM | POA: Diagnosis not present

## 2023-06-01 DIAGNOSIS — Z510 Encounter for antineoplastic radiation therapy: Secondary | ICD-10-CM | POA: Diagnosis not present

## 2023-06-01 DIAGNOSIS — F17210 Nicotine dependence, cigarettes, uncomplicated: Secondary | ICD-10-CM | POA: Diagnosis not present

## 2023-06-01 LAB — RAD ONC ARIA SESSION SUMMARY
Course Elapsed Days: 14
Plan Fractions Treated to Date: 9
Plan Prescribed Dose Per Fraction: 6 Gy
Plan Total Fractions Prescribed: 10
Plan Total Prescribed Dose: 60 Gy
Reference Point Dosage Given to Date: 54 Gy
Reference Point Session Dosage Given: 6 Gy
Session Number: 9

## 2023-06-02 ENCOUNTER — Other Ambulatory Visit: Payer: Self-pay

## 2023-06-02 ENCOUNTER — Ambulatory Visit
Admission: RE | Admit: 2023-06-02 | Discharge: 2023-06-02 | Disposition: A | Discharge status: Home / Self Care | Payer: Medicare HMO | Source: Ambulatory Visit | Attending: Radiation Oncology | Admitting: Radiation Oncology

## 2023-06-02 DIAGNOSIS — C3412 Malignant neoplasm of upper lobe, left bronchus or lung: Secondary | ICD-10-CM | POA: Diagnosis not present

## 2023-06-02 DIAGNOSIS — Z510 Encounter for antineoplastic radiation therapy: Secondary | ICD-10-CM | POA: Diagnosis not present

## 2023-06-02 DIAGNOSIS — F17210 Nicotine dependence, cigarettes, uncomplicated: Secondary | ICD-10-CM | POA: Diagnosis not present

## 2023-06-02 DIAGNOSIS — C3411 Malignant neoplasm of upper lobe, right bronchus or lung: Secondary | ICD-10-CM | POA: Diagnosis not present

## 2023-06-02 LAB — RAD ONC ARIA SESSION SUMMARY
Course Elapsed Days: 15
Plan Fractions Treated to Date: 10
Plan Prescribed Dose Per Fraction: 6 Gy
Plan Total Fractions Prescribed: 10
Plan Total Prescribed Dose: 60 Gy
Reference Point Dosage Given to Date: 60 Gy
Reference Point Session Dosage Given: 6 Gy
Session Number: 10

## 2023-06-07 NOTE — Radiation Completion Notes (Signed)
Patient Name: Jeremy Hall, PUMPHREY MRN: [id number] Date of Birth: 1946/12/17 Referring Physician: Si Gaul, M.D. Date of Service: 2023-06-07 Radiation Oncologist: Arnette Schaumann, M.D. [facility]                             RADIATION ONCOLOGY END OF TREATMENT NOTE     Diagnosis: C34.11 Malignant neoplasm of upper lobe, right bronchus or lung; C34.12 Malignant neoplasm of upper lobe, left bronchus or lung Staging on 2023-03-22: Primary squamous cell carcinoma of upper lobe of left lung (HCC) T=cT1b, N=cN0, M=cM0 Intent: Curative     ==========DELIVERED PLANS==========  First Treatment Date: 2023-05-18 - Last Treatment Date: 2023-06-02   Plan Name: Lung_Rt_UHRT Site: Lung, Right Technique: IMRT Mode: Photon Dose Per Fraction: 6 Gy Prescribed Dose (Delivered / Prescribed): 60 Gy / 60 Gy Prescribed Fxs (Delivered / Prescribed): 10 / 10   Plan Name: Lung_Lt_SBRT Site: Lung, Left Technique: SBRT/SRT-IMRT Mode: Photon Dose Per Fraction: 18 Gy Prescribed Dose (Delivered / Prescribed): 54 Gy / 54 Gy Prescribed Fxs (Delivered / Prescribed): 3 / 3     ==========ON TREATMENT VISIT DATES========== 2023-05-18, 2023-05-23, 2023-05-23, 2023-05-25, 2023-06-02     ==========UPCOMING VISITS==========       ==========APPENDIX - ON TREATMENT VISIT NOTES==========   See weekly On Treatment Notes in Epic for details.

## 2023-06-20 ENCOUNTER — Telehealth: Payer: Self-pay | Admitting: *Deleted

## 2023-06-20 NOTE — Telephone Encounter (Signed)
CALLED PATIENT TO INFORM OF FU APPT. BEING MOVED TO 07-06-23 @ 11:45 AM, SPOKE WITH PATIENT AND HE IS AWARE OF THIS APPT. AND IS GOOD WITH IT

## 2023-06-26 ENCOUNTER — Ambulatory Visit: Payer: Self-pay | Admitting: Radiation Oncology

## 2023-07-05 ENCOUNTER — Telehealth: Payer: Self-pay | Admitting: Radiation Oncology

## 2023-07-05 ENCOUNTER — Encounter: Payer: Self-pay | Admitting: Radiation Oncology

## 2023-07-05 NOTE — Telephone Encounter (Signed)
7/10 @ 4:13 pm patient called to reschedule his FU15 appt for tomorrow.  Forward call to Darryl Nestle so patient could leave a message so they are aware.

## 2023-07-06 ENCOUNTER — Telehealth: Payer: Self-pay | Admitting: *Deleted

## 2023-07-06 ENCOUNTER — Ambulatory Visit
Admission: RE | Admit: 2023-07-06 | Discharge: 2023-07-06 | Disposition: A | Discharge status: Home / Self Care | Payer: Medicare HMO | Source: Ambulatory Visit | Attending: Radiation Oncology | Admitting: Radiation Oncology

## 2023-07-06 HISTORY — DX: Personal history of irradiation: Z92.3

## 2023-07-06 NOTE — Telephone Encounter (Signed)
RETURNED PATIENT'S PHONE CALL, SPOKE WITH PATIENT, HE WANTS TO CANCEL TODAY'S FU, THE APPT. HAS BEEN CANCELLED AND RESCHEDULED FOR 07-27-23 @ 4 PM, PATIENT AGREED TO NEW DATE AND TIME

## 2023-07-26 ENCOUNTER — Ambulatory Visit (HOSPITAL_COMMUNITY)
Admission: RE | Admit: 2023-07-26 | Discharge: 2023-07-26 | Disposition: A | Discharge status: Home / Self Care | Payer: Medicare HMO | Source: Ambulatory Visit | Attending: Internal Medicine | Admitting: Internal Medicine

## 2023-07-26 DIAGNOSIS — C3490 Malignant neoplasm of unspecified part of unspecified bronchus or lung: Secondary | ICD-10-CM | POA: Diagnosis not present

## 2023-07-26 DIAGNOSIS — I700 Atherosclerosis of aorta: Secondary | ICD-10-CM | POA: Diagnosis not present

## 2023-07-26 DIAGNOSIS — J439 Emphysema, unspecified: Secondary | ICD-10-CM | POA: Diagnosis not present

## 2023-07-26 MED ORDER — IOHEXOL 300 MG/ML  SOLN
75.0000 mL | Freq: Once | INTRAMUSCULAR | Status: AC | PRN
  Administered 2023-07-26: 75 mL via INTRAVENOUS

## 2023-07-26 NOTE — Progress Notes (Signed)
  Radiation Oncology         [phone] ________________________________  Name: Jeremy Hall MRN: [id number]  Date: 07/27/2023  DOB: 1946-06-05  End of Treatment Note  Diagnosis: The primary encounter diagnosis was Primary squamous cell carcinoma of upper lobe of left lung (HCC). A diagnosis of Malignant neoplasm of right upper lobe of lung (HCC) was also pertinent to this visit.   Likely simultaneous primary lung cancers: Squamous cell carcinoma of the LUL, and atypical cells of the RUL     Indication for treatment: Curative        Radiation treatment dates:  05/18/23 through 06/02/23   Site/dose:    1) Right lung - 60 Gy delivered in 10 Fx at 6 Gy/Fx 2) Left lung - 54 Gy delivered in 3 Fx at 18 Gy/Fx  Beams/energy: 6X-FFF  Technique/Mode:  1) Right lung - IMRT / Photon  2) Left lung - SBRT/SRT-IMRT / Photon   Narrative: The patient tolerated radiation treatment relatively well and denied any side effects from treatment.   Plan: The patient has completed radiation treatment. The patient will return to radiation oncology clinic for routine followup in one month. I advised them to call or return sooner if they have any questions or concerns related to their recovery or treatment.  -----------------------------------  Billie Lade, PhD, MD  This document serves as a record of services personally performed by Antony Blackbird, MD. It was created on his behalf by Neena Rhymes, a trained medical scribe. The creation of this record is based on the scribe's personal observations and the provider's statements to them. This document has been checked and approved by the attending provider.

## 2023-07-26 NOTE — Progress Notes (Signed)
Radiation Oncology         [phone] ________________________________  Name: Jeremy Hall MRN: [id number]  Date: 07/27/2023  DOB: [birth year]  Follow-Up Visit Note  CC: Emilio Aspen, MD  Si Gaul, MD  No diagnosis found.  Diagnosis:   Likely simultaneous primary lung cancers: Squamous cell carcinoma of the LUL, and atypical cells of the RUL   Interval Since Last Radiation: 1 month and 24 days   Indication for treatment: Curative       Radiation treatment dates:  05/18/23 through 06/02/23  Site/dose:    1) Right lung - 60 Gy delivered in 10 Fx at 6 Gy/Fx 2) Left lung - 54 Gy delivered in 3 Fx at 18 Gy/Fx Beams/energy: 6X-FFF Technique/Mode:  1) Right lung - IMRT / Photon  2) Left lung - SBRT/SRT-IMRT / Photon   Narrative:  The patient returns today for routine follow-up. He tolerated radiation treatment relatively well and denied any side effects from treatment.     He presented for a follow-up chest CT with contrast yesterday which demonstrated: ***  No other significant interval history since the patient completed radiation, or in the interval since his initial consultation date.   ***                           Allergies:  has No Known Allergies.  Meds: Current Outpatient Medications  Medication Sig Dispense Refill   acetaminophen (TYLENOL) 500 MG tablet Take 500 mg by mouth every 8 (eight) hours as needed for mild pain or moderate pain.     albuterol (VENTOLIN HFA) 108 (90 Base) MCG/ACT inhaler Inhale 2 puffs into the lungs every 6 (six) hours as needed for wheezing or shortness of breath.     aspirin EC 81 MG tablet Take 81 mg by mouth daily.     famotidine (PEPCID) 10 MG tablet Take 10 mg by mouth 2 (two) times daily as needed for heartburn or indigestion.     Fluticasone-Umeclidin-Vilant (TRELEGY ELLIPTA) 100-62.5-25 MCG/INH AEPB Inhale 1 puff into the lungs daily. (Patient not taking: Reported on 08/18/2020) 14 each 0   pravastatin (PRAVACHOL)  40 MG tablet Take 40 mg by mouth daily.     Tiotropium Bromide-Olodaterol (STIOLTO RESPIMAT) 2.5-2.5 MCG/ACT AERS Inhale 1 each into the lungs daily. (Patient not taking: Reported on 04/24/2023)     valsartan-hydrochlorothiazide (DIOVAN-HCT) 160-25 MG tablet Take 1 tablet by mouth daily.     No current facility-administered medications for this encounter.    Physical Findings: The patient is in no acute distress. Patient is alert and oriented.  vitals were not taken for this visit. .  No significant changes. Lungs are clear to auscultation bilaterally. Heart has regular rate and rhythm. No palpable cervical, supraclavicular, or axillary adenopathy. Abdomen soft, non-tender, normal bowel sounds.   Lab Findings: Lab Results  Component Value Date   WBC 9.9 03/22/2023   HGB 16.0 03/22/2023   HCT 48.3 03/22/2023   MCV 91.0 03/22/2023   PLT 362 03/22/2023    Radiographic Findings: No results found.  Impression:  Likely simultaneous primary lung cancers: Squamous cell carcinoma of the LUL, and atypical cells of the RUL   The patient is recovering from the effects of radiation.  ***  Plan:  ***   *** minutes of total time was spent for this patient encounter, including preparation, face-to-face counseling with the patient and coordination of care, physical exam, and documentation of the encounter.  ____________________________________  Billie Lade, PhD, MD  This document serves as a record of services personally performed by Antony Blackbird, MD. It was created on his behalf by Neena Rhymes, a trained medical scribe. The creation of this record is based on the scribe's personal observations and the provider's statements to them. This document has been checked and approved by the attending provider.

## 2023-07-27 ENCOUNTER — Encounter: Payer: Self-pay | Admitting: Radiation Oncology

## 2023-07-27 ENCOUNTER — Other Ambulatory Visit: Payer: Self-pay

## 2023-07-27 ENCOUNTER — Ambulatory Visit
Admission: RE | Admit: 2023-07-27 | Discharge: 2023-07-27 | Disposition: A | Discharge status: Home / Self Care | Payer: Medicare HMO | Source: Ambulatory Visit | Attending: Radiation Oncology | Admitting: Radiation Oncology

## 2023-07-27 VITALS — BP 138/69 | HR 71 | Temp 98.0°F | Resp 19 | Ht 72.0 in | Wt 230.4 lb

## 2023-07-27 DIAGNOSIS — Z923 Personal history of irradiation: Secondary | ICD-10-CM | POA: Diagnosis not present

## 2023-07-27 DIAGNOSIS — C3411 Malignant neoplasm of upper lobe, right bronchus or lung: Secondary | ICD-10-CM | POA: Diagnosis not present

## 2023-07-27 DIAGNOSIS — C3412 Malignant neoplasm of upper lobe, left bronchus or lung: Secondary | ICD-10-CM | POA: Insufficient documentation

## 2023-07-27 DIAGNOSIS — F17200 Nicotine dependence, unspecified, uncomplicated: Secondary | ICD-10-CM

## 2023-07-27 MED ORDER — NICOTINE 14 MG/24HR TD PT24
14.0000 mg | MEDICATED_PATCH | Freq: Every day | TRANSDERMAL | 0 refills | Status: AC
Start: 2023-07-27 — End: ?

## 2023-07-27 MED ORDER — NICOTINE 7 MG/24HR TD PT24: 7.0000 mg | MEDICATED_PATCH | Freq: Every day | TRANSDERMAL | 0 refills | Status: AC

## 2023-07-27 MED ORDER — NICOTINE 14 MG/24HR TD PT24: 14.0000 mg | MEDICATED_PATCH | Freq: Every day | TRANSDERMAL | 0 refills | Status: DC

## 2023-07-27 MED ORDER — BUPROPION HCL ER (SR) 150 MG PO TB12
ORAL_TABLET | ORAL | 2 refills | Status: AC
Start: 2023-07-27 — End: ?

## 2023-07-27 NOTE — Progress Notes (Signed)
Jeremy Hall is here today for follow up post radiation to the lung.  Lung Side: Right Lung  Does the patient complain of any of the following: Pain:No Shortness of breath w/wo exertion: Yes, intermittently Cough: No Hemoptysis: No Pain with swallowing: No Swallowing/choking concerns: No  Appetite: Fair Energy Level:Fair Post radiation skin Changes: No    Additional comments if applicable:

## 2024-03-04 ENCOUNTER — Encounter: Payer: Self-pay | Admitting: Emergency Medicine

## 2024-04-03 DIAGNOSIS — Z6832 Body mass index (BMI) 32.0-32.9, adult: Secondary | ICD-10-CM | POA: Diagnosis not present

## 2024-04-03 DIAGNOSIS — E1169 Type 2 diabetes mellitus with other specified complication: Secondary | ICD-10-CM | POA: Diagnosis not present

## 2024-04-03 DIAGNOSIS — Z85118 Personal history of other malignant neoplasm of bronchus and lung: Secondary | ICD-10-CM | POA: Diagnosis not present

## 2024-04-03 DIAGNOSIS — R2681 Unsteadiness on feet: Secondary | ICD-10-CM | POA: Diagnosis not present

## 2024-04-03 DIAGNOSIS — E785 Hyperlipidemia, unspecified: Secondary | ICD-10-CM | POA: Diagnosis not present

## 2024-04-03 DIAGNOSIS — J449 Chronic obstructive pulmonary disease, unspecified: Secondary | ICD-10-CM | POA: Diagnosis not present

## 2024-04-03 DIAGNOSIS — I10 Essential (primary) hypertension: Secondary | ICD-10-CM | POA: Diagnosis not present

## 2024-04-03 DIAGNOSIS — F17210 Nicotine dependence, cigarettes, uncomplicated: Secondary | ICD-10-CM | POA: Diagnosis not present

## 2024-04-03 DIAGNOSIS — E669 Obesity, unspecified: Secondary | ICD-10-CM | POA: Diagnosis not present

## 2024-04-03 DIAGNOSIS — Z008 Encounter for other general examination: Secondary | ICD-10-CM | POA: Diagnosis not present

## 2024-04-17 DIAGNOSIS — E1165 Type 2 diabetes mellitus with hyperglycemia: Secondary | ICD-10-CM | POA: Diagnosis not present

## 2024-04-17 DIAGNOSIS — E559 Vitamin D deficiency, unspecified: Secondary | ICD-10-CM | POA: Diagnosis not present

## 2024-04-17 DIAGNOSIS — J439 Emphysema, unspecified: Secondary | ICD-10-CM | POA: Diagnosis not present

## 2024-04-17 DIAGNOSIS — F17200 Nicotine dependence, unspecified, uncomplicated: Secondary | ICD-10-CM | POA: Diagnosis not present

## 2024-04-17 DIAGNOSIS — Z0000 Encounter for general adult medical examination without abnormal findings: Secondary | ICD-10-CM | POA: Diagnosis not present

## 2024-04-17 DIAGNOSIS — Z79899 Other long term (current) drug therapy: Secondary | ICD-10-CM | POA: Diagnosis not present

## 2024-04-17 DIAGNOSIS — E1151 Type 2 diabetes mellitus with diabetic peripheral angiopathy without gangrene: Secondary | ICD-10-CM | POA: Diagnosis not present

## 2024-04-17 DIAGNOSIS — Z1211 Encounter for screening for malignant neoplasm of colon: Secondary | ICD-10-CM | POA: Diagnosis not present

## 2024-04-17 DIAGNOSIS — E7800 Pure hypercholesterolemia, unspecified: Secondary | ICD-10-CM | POA: Diagnosis not present

## 2024-04-17 DIAGNOSIS — I739 Peripheral vascular disease, unspecified: Secondary | ICD-10-CM | POA: Diagnosis not present

## 2024-04-17 DIAGNOSIS — Z23 Encounter for immunization: Secondary | ICD-10-CM | POA: Diagnosis not present

## 2024-04-17 DIAGNOSIS — E1142 Type 2 diabetes mellitus with diabetic polyneuropathy: Secondary | ICD-10-CM | POA: Diagnosis not present

## 2024-04-17 DIAGNOSIS — Z1331 Encounter for screening for depression: Secondary | ICD-10-CM | POA: Diagnosis not present

## 2024-04-17 DIAGNOSIS — E1136 Type 2 diabetes mellitus with diabetic cataract: Secondary | ICD-10-CM | POA: Diagnosis not present

## 2024-04-17 DIAGNOSIS — I10 Essential (primary) hypertension: Secondary | ICD-10-CM | POA: Diagnosis not present

## 2024-05-25 DIAGNOSIS — C3490 Malignant neoplasm of unspecified part of unspecified bronchus or lung: Secondary | ICD-10-CM | POA: Diagnosis not present

## 2024-05-25 DIAGNOSIS — J449 Chronic obstructive pulmonary disease, unspecified: Secondary | ICD-10-CM | POA: Diagnosis not present

## 2024-05-25 DIAGNOSIS — E7800 Pure hypercholesterolemia, unspecified: Secondary | ICD-10-CM | POA: Diagnosis not present

## 2024-05-25 DIAGNOSIS — E1142 Type 2 diabetes mellitus with diabetic polyneuropathy: Secondary | ICD-10-CM | POA: Diagnosis not present

## 2024-05-31 DIAGNOSIS — J449 Chronic obstructive pulmonary disease, unspecified: Secondary | ICD-10-CM | POA: Diagnosis not present

## 2024-05-31 DIAGNOSIS — J439 Emphysema, unspecified: Secondary | ICD-10-CM | POA: Diagnosis not present

## 2024-05-31 DIAGNOSIS — E1136 Type 2 diabetes mellitus with diabetic cataract: Secondary | ICD-10-CM | POA: Diagnosis not present

## 2024-05-31 DIAGNOSIS — C3490 Malignant neoplasm of unspecified part of unspecified bronchus or lung: Secondary | ICD-10-CM | POA: Diagnosis not present

## 2024-05-31 DIAGNOSIS — Z79899 Other long term (current) drug therapy: Secondary | ICD-10-CM | POA: Diagnosis not present

## 2024-05-31 DIAGNOSIS — E1151 Type 2 diabetes mellitus with diabetic peripheral angiopathy without gangrene: Secondary | ICD-10-CM | POA: Diagnosis not present

## 2024-05-31 DIAGNOSIS — E1165 Type 2 diabetes mellitus with hyperglycemia: Secondary | ICD-10-CM | POA: Diagnosis not present

## 2024-05-31 DIAGNOSIS — E1142 Type 2 diabetes mellitus with diabetic polyneuropathy: Secondary | ICD-10-CM | POA: Diagnosis not present

## 2024-05-31 DIAGNOSIS — E559 Vitamin D deficiency, unspecified: Secondary | ICD-10-CM | POA: Diagnosis not present

## 2024-05-31 DIAGNOSIS — I10 Essential (primary) hypertension: Secondary | ICD-10-CM | POA: Diagnosis not present

## 2024-06-24 DIAGNOSIS — E7800 Pure hypercholesterolemia, unspecified: Secondary | ICD-10-CM | POA: Diagnosis not present

## 2024-06-24 DIAGNOSIS — E1142 Type 2 diabetes mellitus with diabetic polyneuropathy: Secondary | ICD-10-CM | POA: Diagnosis not present

## 2024-06-24 DIAGNOSIS — C3490 Malignant neoplasm of unspecified part of unspecified bronchus or lung: Secondary | ICD-10-CM | POA: Diagnosis not present

## 2024-06-24 DIAGNOSIS — J449 Chronic obstructive pulmonary disease, unspecified: Secondary | ICD-10-CM | POA: Diagnosis not present

## 2024-08-25 DIAGNOSIS — E1142 Type 2 diabetes mellitus with diabetic polyneuropathy: Secondary | ICD-10-CM | POA: Diagnosis not present

## 2024-08-25 DIAGNOSIS — E7800 Pure hypercholesterolemia, unspecified: Secondary | ICD-10-CM | POA: Diagnosis not present

## 2024-08-25 DIAGNOSIS — J449 Chronic obstructive pulmonary disease, unspecified: Secondary | ICD-10-CM | POA: Diagnosis not present

## 2024-08-25 DIAGNOSIS — C3490 Malignant neoplasm of unspecified part of unspecified bronchus or lung: Secondary | ICD-10-CM | POA: Diagnosis not present

## 2024-09-24 DIAGNOSIS — E7800 Pure hypercholesterolemia, unspecified: Secondary | ICD-10-CM | POA: Diagnosis not present

## 2024-09-24 DIAGNOSIS — E1142 Type 2 diabetes mellitus with diabetic polyneuropathy: Secondary | ICD-10-CM | POA: Diagnosis not present

## 2024-09-24 DIAGNOSIS — C3490 Malignant neoplasm of unspecified part of unspecified bronchus or lung: Secondary | ICD-10-CM | POA: Diagnosis not present

## 2024-09-24 DIAGNOSIS — J449 Chronic obstructive pulmonary disease, unspecified: Secondary | ICD-10-CM | POA: Diagnosis not present

## 2024-10-03 DIAGNOSIS — I10 Essential (primary) hypertension: Secondary | ICD-10-CM | POA: Diagnosis not present

## 2024-10-03 DIAGNOSIS — R351 Nocturia: Secondary | ICD-10-CM | POA: Diagnosis not present

## 2024-10-03 DIAGNOSIS — Z23 Encounter for immunization: Secondary | ICD-10-CM | POA: Diagnosis not present

## 2025-01-03 NOTE — Progress Notes (Signed)
 Jeremy Hall                                          MRN: [id number]   01/03/2025   The [facility] Quality Team Specialist reviewed this patient medical record for the purposes of chart review for care gap closure. The following were reviewed: chart review for care gap closure-controlling blood pressure.    [facility] Quality Team
